# Patient Record
Sex: Female | Born: 1950
Health system: Southern US, Community
[De-identification: ages and names within clinical notes are randomized; demographics above are authoritative.]

## PROBLEM LIST (undated history)

## (undated) DIAGNOSIS — Z9889 Other specified postprocedural states: Secondary | ICD-10-CM

## (undated) DIAGNOSIS — T8859XA Other complications of anesthesia, initial encounter: Secondary | ICD-10-CM

## (undated) DIAGNOSIS — F32A Depression, unspecified: Secondary | ICD-10-CM

## (undated) DIAGNOSIS — F419 Anxiety disorder, unspecified: Secondary | ICD-10-CM

## (undated) DIAGNOSIS — IMO0002 Reserved for concepts with insufficient information to code with codable children: Secondary | ICD-10-CM

## (undated) DIAGNOSIS — F909 Attention-deficit hyperactivity disorder, unspecified type: Secondary | ICD-10-CM

## (undated) HISTORY — DX: Reserved for concepts with insufficient information to code with codable children: IMO0002

## (undated) HISTORY — PX: DILATION AND CURETTAGE OF UTERUS: SHX78

## (undated) HISTORY — PX: COLONOSCOPY: SHX174

---

## 1997-08-14 ENCOUNTER — Other Ambulatory Visit: Admission: RE | Admit: 1997-08-14 | Discharge: 1997-08-14 | Payer: Self-pay | Admitting: Obstetrics & Gynecology

## 1998-03-12 ENCOUNTER — Other Ambulatory Visit: Admission: RE | Admit: 1998-03-12 | Discharge: 1998-03-12 | Payer: Self-pay | Admitting: Obstetrics & Gynecology

## 1999-04-01 ENCOUNTER — Other Ambulatory Visit: Admission: RE | Admit: 1999-04-01 | Discharge: 1999-04-01 | Payer: Self-pay | Admitting: Obstetrics & Gynecology

## 2000-07-20 ENCOUNTER — Other Ambulatory Visit: Admission: RE | Admit: 2000-07-20 | Discharge: 2000-07-20 | Payer: Self-pay | Admitting: Obstetrics & Gynecology

## 2001-08-30 ENCOUNTER — Other Ambulatory Visit: Admission: RE | Admit: 2001-08-30 | Discharge: 2001-08-30 | Payer: Self-pay | Admitting: Obstetrics & Gynecology

## 2002-09-19 ENCOUNTER — Other Ambulatory Visit: Admission: RE | Admit: 2002-09-19 | Discharge: 2002-09-19 | Payer: Self-pay | Admitting: Obstetrics & Gynecology

## 2003-11-12 ENCOUNTER — Other Ambulatory Visit: Admission: RE | Admit: 2003-11-12 | Discharge: 2003-11-12 | Payer: Self-pay | Admitting: Obstetrics & Gynecology

## 2005-02-02 ENCOUNTER — Other Ambulatory Visit: Admission: RE | Admit: 2005-02-02 | Discharge: 2005-02-02 | Payer: Self-pay | Admitting: Obstetrics & Gynecology

## 2008-01-10 ENCOUNTER — Encounter: Admission: RE | Admit: 2008-01-10 | Discharge: 2008-01-10 | Payer: Self-pay | Admitting: Geriatric Medicine

## 2009-04-10 IMAGING — CT CT VIRTUAL COLONOSCOPY SCREENING
3 of 6 series · 13 of 46 positions shown, 19 images · non-contrast
Comparison: None

CLINICAL DATA: Incomplete colonoscopy 1 year ago

CT VIRTUAL COLONOSCOPY FOR SCREENING
TECHNIQUE: The patient was given a standard low so bowel
preparation with Gastrografin and barium for fluid and stool
tagging respectively.  The quality of the bowel preparation is
excellent.  Automated CO2 insufflation of the colon was performed
prior to image acquisition and colonic distention is excellent.

[Series 2: supine · axial · 0.62mm/px · z∈[-466,-114]mm · 9 of 353 slices shown, 15 images]
[im 36/353  soft-tissue]
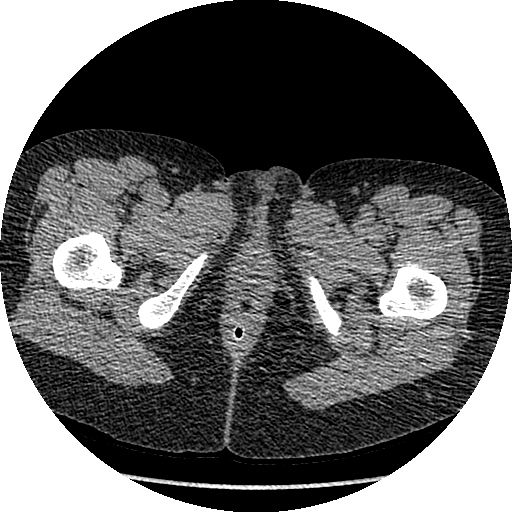
[im 36/353  bone]
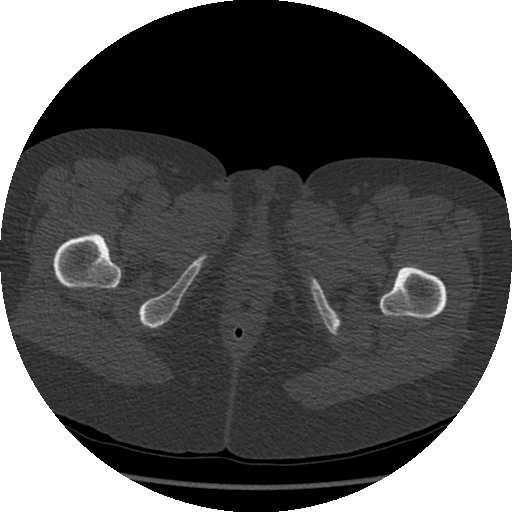
[im 71/353  soft-tissue]
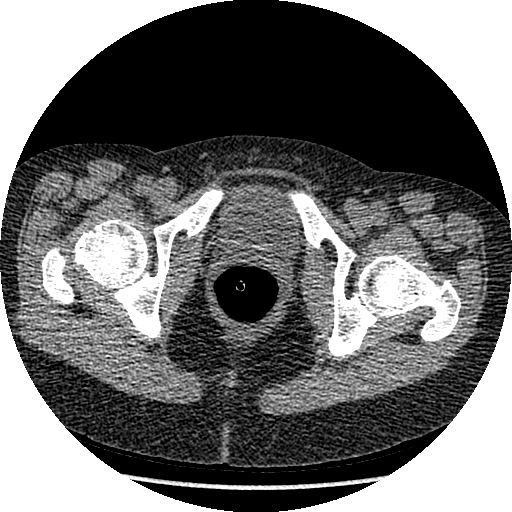
[im 106/353  soft-tissue]
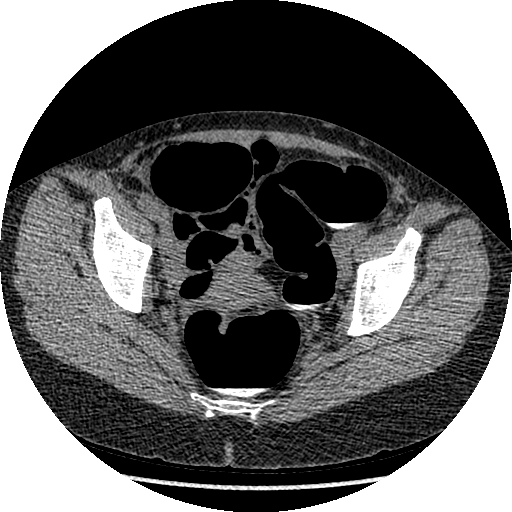
[im 141/353  soft-tissue]
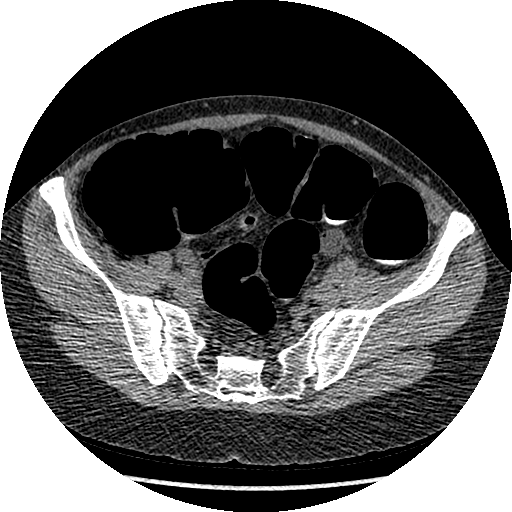
[im 177/353  soft-tissue]
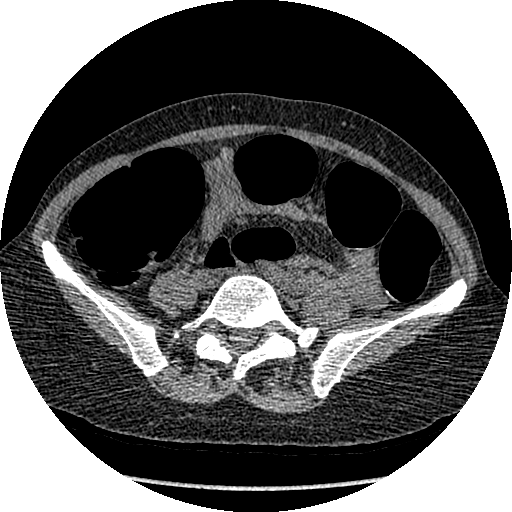
[im 212/353  soft-tissue]
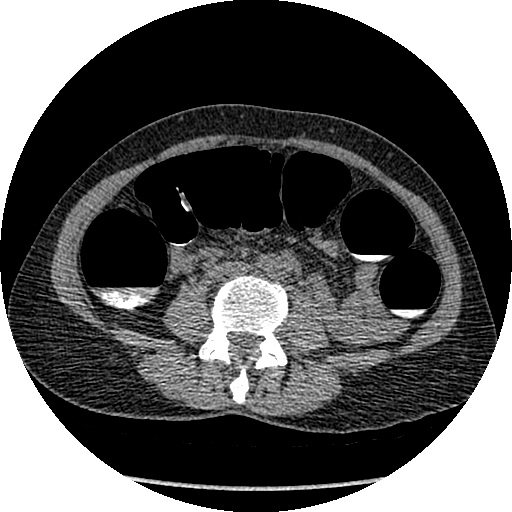
[im 212/353  lung]
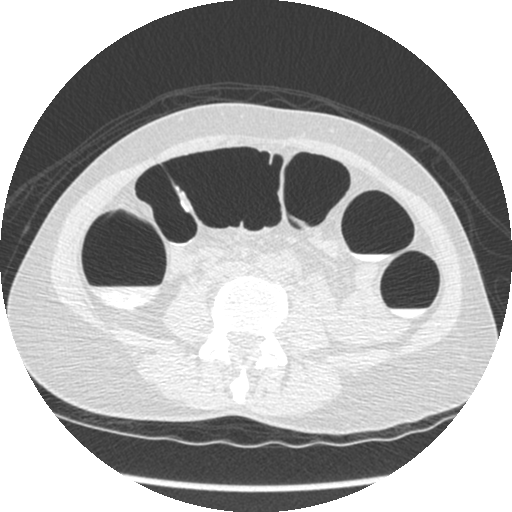
[im 247/353  soft-tissue]
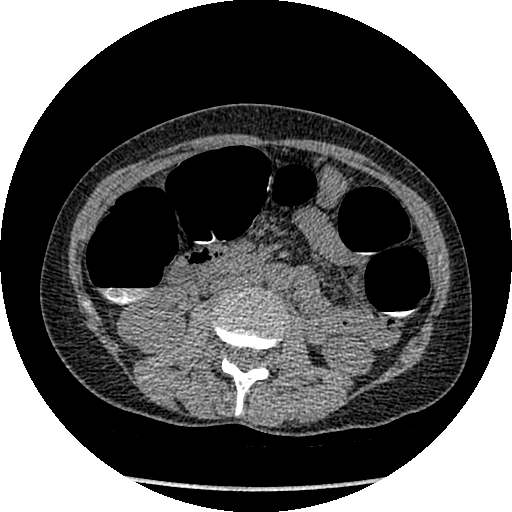
[im 247/353  lung]
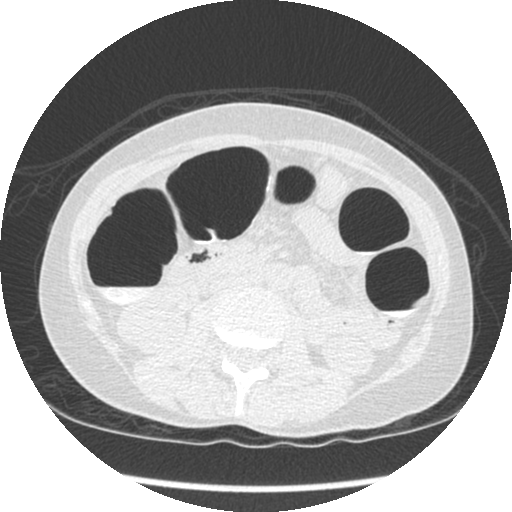
[im 282/353  soft-tissue]
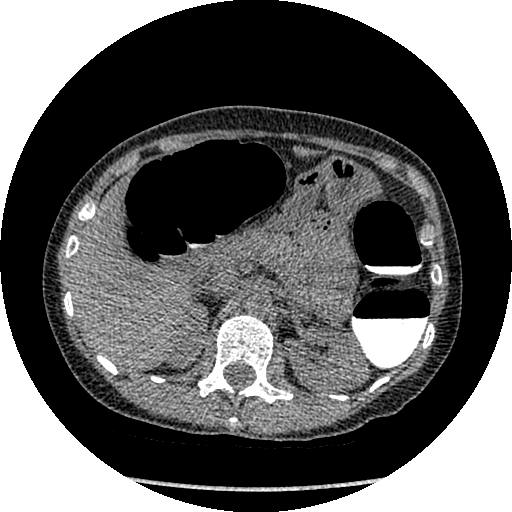
[im 282/353  lung]
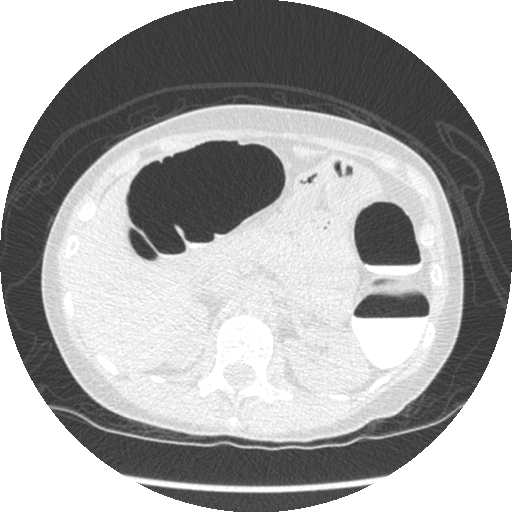
[im 317/353  soft-tissue]
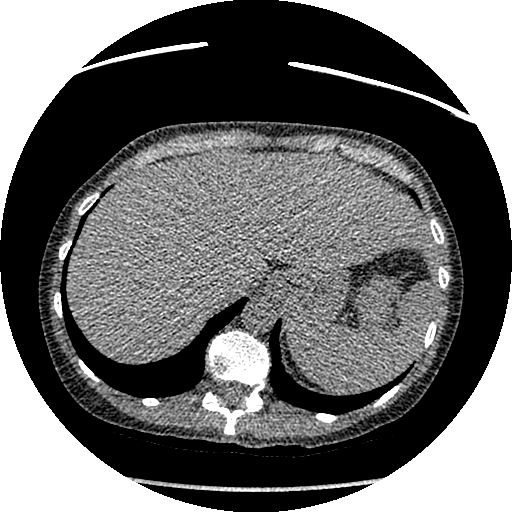
[im 317/353  lung]
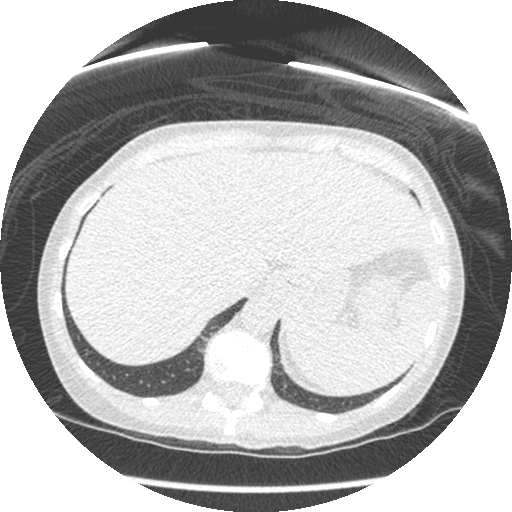
[im 317/353  bone]
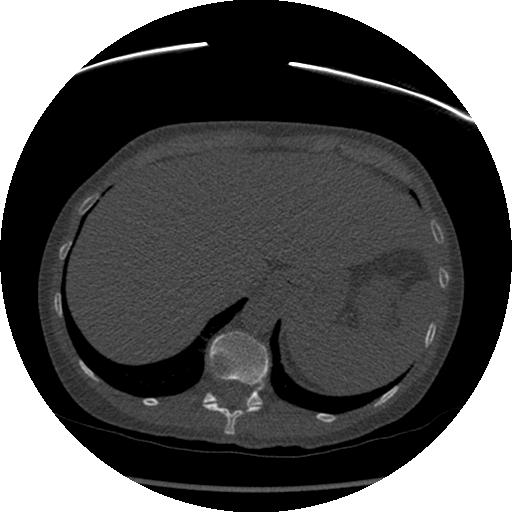

[Series 601: coronal body · coronal · 0.86mm/px · 1 of 97 slices shown]
[im 33/97  soft-tissue]
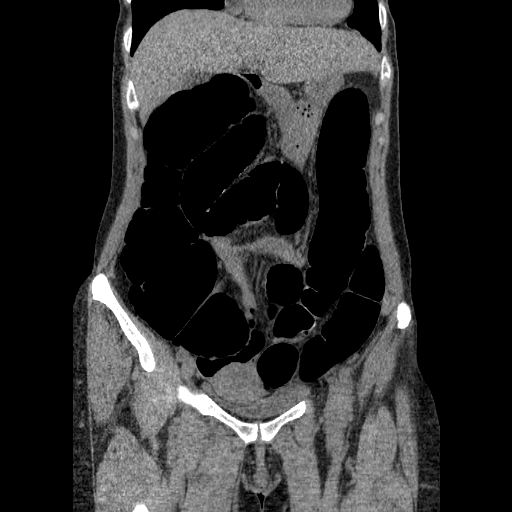

[Series 602: sagittal body · sagittal · 0.86mm/px · 3 of 129 slices shown]
[im 43/129  soft-tissue]
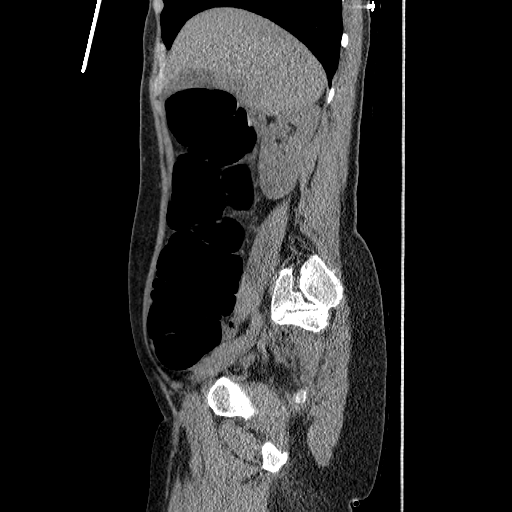
[im 57/129  soft-tissue]
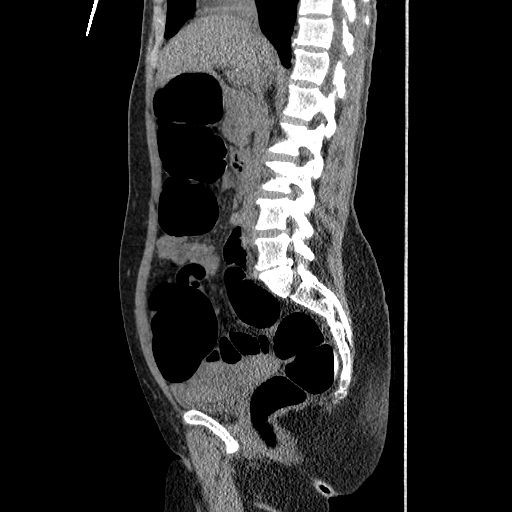
[im 72/129  soft-tissue]
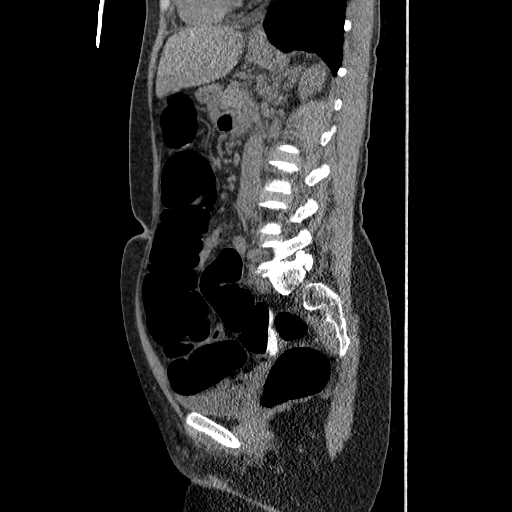

[13 of 46 positions shown; findings below may reference images not displayed]

FINDINGS: No clinically significant polyp is seen.  The small
amount of retained feces and fluid is well tagged.  The sigmoid
colon and the transverse colon are noted to be rather tortuous.
This study is considered C-Rads category C-1, normal colon, pain
routine screening.
IMPRESSION: No clinically significant polyp.  C-Rads category C-1,  i.e. normal
colon, continue routine screening.

Virtual colonoscopy is not designed to detect diminutive polyps
(i.e., less than or equal to 5 mm), the presence or absence of
which may not affect clinical management

CT ABDOMEN AND PELVIS WITHOUT CONTRAST

CT ABDOMEN
FINDINGS: The lung bases are clear.  The liver appears normal in
the unenhanced state.  No calcified gallstones are noted.  The
pancreas is normal in size and the pancreatic duct does not appear
to be dilated.  The adrenal glands and spleen appear normal.  The
kidneys appear normal in the unenhanced state with no mass or
hydronephrosis noted.  The abdominal aorta is normal in caliber.
IMPRESSION: Negative unenhanced CT of the abdomen.

CT PELVIS
FINDINGS: The uterus is normal in size.  The urinary bladder is
decompressed and cannot be evaluated.  No pelvic mass, fluid, or
adenopathy is seen.  There is degenerative disc disease at the L5-
S1 level.
IMPRESSION: Negative unenhanced CT of the pelvis.

## 2009-06-04 DIAGNOSIS — IMO0002 Reserved for concepts with insufficient information to code with codable children: Secondary | ICD-10-CM

## 2009-06-04 HISTORY — DX: Reserved for concepts with insufficient information to code with codable children: IMO0002

## 2009-06-04 HISTORY — PX: ABDOMINAL HYSTERECTOMY: SHX81

## 2009-11-17 ENCOUNTER — Ambulatory Visit: Admission: RE | Admit: 2009-11-17 | Discharge: 2009-11-17 | Payer: Self-pay | Admitting: Gynecologic Oncology

## 2009-11-17 ENCOUNTER — Other Ambulatory Visit: Admission: RE | Admit: 2009-11-17 | Discharge: 2009-11-17 | Payer: Self-pay | Admitting: Gynecologic Oncology

## 2010-08-03 ENCOUNTER — Ambulatory Visit: Payer: BC Managed Care – PPO | Attending: Gynecologic Oncology | Admitting: Gynecologic Oncology

## 2010-08-03 DIAGNOSIS — C549 Malignant neoplasm of corpus uteri, unspecified: Secondary | ICD-10-CM | POA: Insufficient documentation

## 2010-08-03 DIAGNOSIS — Z90710 Acquired absence of both cervix and uterus: Secondary | ICD-10-CM | POA: Insufficient documentation

## 2010-08-03 DIAGNOSIS — Z9079 Acquired absence of other genital organ(s): Secondary | ICD-10-CM | POA: Insufficient documentation

## 2010-08-04 NOTE — Consult Note (Signed)
Megan, Phillips           ACCOUNT NO.:  [id number]  MEDICAL RECORD NO.:  [id number]           PATIENT TYPE:  O  LOCATION:  GYN                          FACILITY:  [facility]  PHYSICIAN:  Yony Roulston A. Duard Brady, MD    DATE OF BIRTH:  26-Jan-1951  DATE OF CONSULTATION:  08/03/2010 DATE OF DISCHARGE:                                CONSULTATION   HISTORY:  Megan Phillips is a very pleasant 60 year old with history of vaginal bleeding and cramping.  In January 2011, endometrial stripe was noted to be 8 mm and she underwent an endometrial biopsy that revealed a grade 1 endometrioid adenocarcinoma.  In April 2011, she underwent robotic hysterectomy and BSO, pelvic lymph node dissection.  Operative findings included a 10 cm uterus with 2 to 3 pedunculated fibroids. Final pathology revealed a grade 1 endometrioid adenocarcinoma with 4% myometrial invasion.  No lymphovascular space involvement.  The adnexa, the washings, the cervix, and 0 out of 11 lymph nodes were negative. Her tumor was ER/PR positive.  I last saw her in September 2011, at which time her exam was unremarkable and a Pap smear was negative.  She was seen by Dr. Jennette Kettle about 4 months ago with negative exam and comes in today for followup.  She overall is doing quite well.  She did stop taking her Vivelle and she had been feeling well, but she noticed a decrease in her libido.  She put the patch back on a couple months ago and felt that her libido was improving and she would like to restart the Vivelle-Dot.  She denies any vaginal bleeding, change in bowel or bladder habits, abdominal or pelvic pain, is overall enjoying a good quality of life and is feeling quite well.  There are no new medical issues for her or her husband.  She continues to be very active with work and exercising.  PHYSICAL EXAMINATION:  VITAL SIGNS:  Weight 114 pounds, blood pressure 100/60, pulse 80, temperature 98.6. GENERAL:  Well-nourished, well-developed  female, in no acute distress. NECK:  Supple.  There is no lymphadenopathy, no thyromegaly. LUNGS:  Clear to auscultation bilaterally. CARDIOVASCULAR:  Regular rate and rhythm. ABDOMEN:  Shows well-healed laparoscopic incisions.  There is no evidence of any incisional hernias.  Abdomen is soft, nontender, nondistended.  No palpable masses or hepatosplenomegaly.  Groins are negative for adenopathy. EXTREMITIES:  There is no edema. PELVIC:  External genitalia is within normal limits.  The vagina is atrophic.  The vaginal cuff is visualized.  There are no visible lesions.  Bimanual examination reveals no masses or nodularity.  Rectal confirms.  ASSESSMENT:  60 year old with stage IA grade 1 endometrioid adenocarcinoma diagnosed and treated a year ago who has no evidence of recurrent disease.  PLAN: 1. I refilled her Vivelle.  She would like to drop down, so we wrote     her for Vivelle  0.025 dosage, she was given enough for a year. 2. She will see Dr. Jennette Kettle in 6 months and return to see [place] in 1 year.     Jamarri Vuncannon A. Duard Brady, MD     PAG/MEDQ  D:  08/03/2010  T:  08/04/2010  Job:  [id number]  cc:   Freddy Finner, M.D. Fax: [phone]  Hal T. Stoneking, M.D. Fax: [phone]  Telford Nab, R.N. [address] [place], [place] [address]  Electronically Signed by Cleda Mccreedy MD on 08/04/2010 09:08:29 AM

## 2011-10-24 ENCOUNTER — Encounter: Payer: Self-pay | Admitting: Gynecologic Oncology

## 2011-10-25 ENCOUNTER — Ambulatory Visit: Payer: BC Managed Care – PPO | Attending: Gynecologic Oncology | Admitting: Gynecologic Oncology

## 2011-10-25 ENCOUNTER — Encounter: Payer: Self-pay | Admitting: Gynecologic Oncology

## 2011-10-25 VITALS — BP 100/62 | HR 68 | Temp 98.5°F | Resp 14 | Ht 63.0 in | Wt 114.3 lb

## 2011-10-25 DIAGNOSIS — Z90710 Acquired absence of both cervix and uterus: Secondary | ICD-10-CM | POA: Insufficient documentation

## 2011-10-25 DIAGNOSIS — C541 Malignant neoplasm of endometrium: Secondary | ICD-10-CM | POA: Insufficient documentation

## 2011-10-25 DIAGNOSIS — Z9079 Acquired absence of other genital organ(s): Secondary | ICD-10-CM | POA: Insufficient documentation

## 2011-10-25 DIAGNOSIS — C549 Malignant neoplasm of corpus uteri, unspecified: Secondary | ICD-10-CM | POA: Insufficient documentation

## 2011-10-25 NOTE — Patient Instructions (Signed)
RTC 1 year

## 2011-10-25 NOTE — Progress Notes (Signed)
Consult Note: Gyn-Onc  Megan Phillips 61 y.o. female  CC:  Chief Complaint  Patient presents with  . Endo Adenocarcinoma    Follow up    HPI: Megan Phillips is a very pleasant 61 year old with history of vaginal bleeding and cramping. In January 2011, endometrial stripe was  noted to be 8 mm and she underwent an endometrial biopsy that revealed a grade 1 endometrioid adenocarcinoma. In April 2011, she underwent  robotic hysterectomy and BSO, pelvic lymph node dissection. Operative findings included a 10 cm uterus with 2 to 3 pedunculated fibroids.  Final pathology revealed a grade 1 endometrioid adenocarcinoma with 4% myometrial invasion. No lymphovascular space involvement. The adnexa, the washings, the cervix, and 0 out of 11 lymph nodes were negative. Her tumor was ER/PR positive. I last saw her in September 2011, at  which time her exam was unremarkable and a Pap smear was negative. She was seen by Dr. Jennette Kettle about 6 months ago with negative exam and pap smear and comes in today for followup.  Interval History:  She overall is doing quite well. She denies any vaginal bleeding, change in bowel or bladder habits, abdominal or pelvic pain, is overall enjoying a good quality of life and is feeling quite well. There are no new medical issues for her or her husband. She continues to be very active with  work and exercising 3 days a week. She uses her HRT primarily for sexual function.   Review of Systems: 10 point ROS is negative as reviewed with her.  Current Meds:  Outpatient Encounter Prescriptions as of 10/25/2011  Medication Sig Dispense Refill  . estradiol (VIVELLE-DOT) 0.0375 MG/24HR Place 1 patch onto the skin 2 (two) times a week.      . methylphenidate (RITALIN) 5 MG tablet Take 2.5 mg by mouth daily.        Allergy: No Known Allergies  Social Hx:   History   Social History  . Marital Status: Married    Spouse Name: N/A    Number of Children: N/A  . Years of Education:  N/A   Occupational History  . Not on file.   Social History Main Topics  . Smoking status: Never Smoker   . Smokeless tobacco: Not on file  . Alcohol Use: Yes     Few glasses of wine a week  . Drug Use: Not on file  . Sexually Active: Not on file   Other Topics Concern  . Not on file   Social History Narrative  . No narrative on file    Past Surgical Hx:  Past Surgical History  Procedure Date  . Abdominal hysterectomy 06/2009    Robotic Hyst, BSO, pelvic lymph node dissection    Past Medical Hx:  Past Medical History  Diagnosis Date  . Endometrioid adenocarcinoma 06/2009    Stage 1A grade 1    Family Hx: History reviewed. No pertinent family history.  Vitals:  Blood pressure 100/62, pulse 68, temperature 98.5 F (36.9 C), resp. rate 14, height 5\' 3"  (1.6 m), weight 114 lb 4.8 oz (51.846 kg).  Physical Exam:  GENERAL: Well-nourished, well-developed female, in no acute distress.  NECK: Supple. There is no lymphadenopathy, no thyromegaly.  LUNGS: Clear to auscultation bilaterally.  CARDIOVASCULAR: Regular rate and rhythm.  ABDOMEN: Shows well-healed laparoscopic incisions. There is no evidence of any incisional hernias. Abdomen is soft, nontender,  nondistended. No palpable masses or hepatosplenomegaly. Groins are negative for adenopathy.  EXTREMITIES: There is no edema.  PELVIC: External genitalia is within normal limits. The vagina is atrophic. The vaginal cuff is visualized. There are no visible  lesions. Bimanual examination reveals no masses or nodularity. Rectal confirms.   Assessment/Plan: ASSESSMENT: 61 year old with stage IA grade 1 endometrioid adenocarcinoma diagnosed and treated a year ago who has no evidence of  recurrent disease.   PLAN:  1.She will see Dr. Jennette Kettle in 6 months and return to see [place] in 1 year.  She has her annual exam with Dr. Pete Glatter next month.  Falynn Ailey A., MD 10/25/2011, 8:52 AM

## 2013-05-02 DIAGNOSIS — Z1231 Encounter for screening mammogram for malignant neoplasm of breast: Secondary | ICD-10-CM | POA: Diagnosis not present

## 2013-05-08 DIAGNOSIS — H1044 Vernal conjunctivitis: Secondary | ICD-10-CM | POA: Diagnosis not present

## 2013-05-08 DIAGNOSIS — Z9189 Other specified personal risk factors, not elsewhere classified: Secondary | ICD-10-CM | POA: Diagnosis not present

## 2013-09-26 DIAGNOSIS — H40059 Ocular hypertension, unspecified eye: Secondary | ICD-10-CM | POA: Diagnosis not present

## 2013-12-05 DIAGNOSIS — Z0000 Encounter for general adult medical examination without abnormal findings: Secondary | ICD-10-CM | POA: Diagnosis not present

## 2013-12-05 DIAGNOSIS — L989 Disorder of the skin and subcutaneous tissue, unspecified: Secondary | ICD-10-CM | POA: Diagnosis not present

## 2013-12-05 DIAGNOSIS — M859 Disorder of bone density and structure, unspecified: Secondary | ICD-10-CM | POA: Diagnosis not present

## 2013-12-05 DIAGNOSIS — Z79899 Other long term (current) drug therapy: Secondary | ICD-10-CM | POA: Diagnosis not present

## 2013-12-05 DIAGNOSIS — F900 Attention-deficit hyperactivity disorder, predominantly inattentive type: Secondary | ICD-10-CM | POA: Diagnosis not present

## 2013-12-05 DIAGNOSIS — Z23 Encounter for immunization: Secondary | ICD-10-CM | POA: Diagnosis not present

## 2014-10-21 DIAGNOSIS — H1013 Acute atopic conjunctivitis, bilateral: Secondary | ICD-10-CM | POA: Diagnosis not present

## 2014-12-11 DIAGNOSIS — Z0000 Encounter for general adult medical examination without abnormal findings: Secondary | ICD-10-CM | POA: Diagnosis not present

## 2014-12-11 DIAGNOSIS — Z1389 Encounter for screening for other disorder: Secondary | ICD-10-CM | POA: Diagnosis not present

## 2014-12-11 DIAGNOSIS — H9190 Unspecified hearing loss, unspecified ear: Secondary | ICD-10-CM | POA: Diagnosis not present

## 2014-12-11 DIAGNOSIS — R636 Underweight: Secondary | ICD-10-CM | POA: Diagnosis not present

## 2014-12-11 DIAGNOSIS — Z79899 Other long term (current) drug therapy: Secondary | ICD-10-CM | POA: Diagnosis not present

## 2014-12-11 DIAGNOSIS — F900 Attention-deficit hyperactivity disorder, predominantly inattentive type: Secondary | ICD-10-CM | POA: Diagnosis not present

## 2014-12-11 DIAGNOSIS — Z23 Encounter for immunization: Secondary | ICD-10-CM | POA: Diagnosis not present

## 2014-12-23 DIAGNOSIS — H903 Sensorineural hearing loss, bilateral: Secondary | ICD-10-CM | POA: Diagnosis not present

## 2014-12-23 DIAGNOSIS — H9312 Tinnitus, left ear: Secondary | ICD-10-CM | POA: Diagnosis not present

## 2015-04-14 ENCOUNTER — Ambulatory Visit (INDEPENDENT_AMBULATORY_CARE_PROVIDER_SITE_OTHER): Payer: Medicare Other | Admitting: Family Medicine

## 2015-04-14 VITALS — BP 100/66 | HR 90 | Temp 100.2°F | Resp 14 | Ht 64.5 in | Wt 113.4 lb

## 2015-04-14 DIAGNOSIS — R05 Cough: Secondary | ICD-10-CM

## 2015-04-14 DIAGNOSIS — J029 Acute pharyngitis, unspecified: Secondary | ICD-10-CM | POA: Diagnosis not present

## 2015-04-14 DIAGNOSIS — R059 Cough, unspecified: Secondary | ICD-10-CM

## 2015-04-14 DIAGNOSIS — R0981 Nasal congestion: Secondary | ICD-10-CM

## 2015-04-14 MED ORDER — ALBUTEROL SULFATE 108 (90 BASE) MCG/ACT IN AEPB: 2.0000 | INHALATION_SPRAY | Freq: Four times a day (QID) | RESPIRATORY_TRACT | Status: DC | PRN

## 2015-04-14 MED ORDER — AMOXICILLIN-POT CLAVULANATE 875-125 MG PO TABS: 1.0000 | ORAL_TABLET | Freq: Two times a day (BID) | ORAL | Status: DC

## 2015-04-14 NOTE — Patient Instructions (Signed)
Please let me know if symptoms change, worsen, or are not helped by medications.

## 2015-04-14 NOTE — Progress Notes (Signed)
By signing my name below, I, Moises Blood, attest that this documentation has been prepared under the direction and in the presence of Robyn Haber, MD. Electronically Signed: Moises Blood, [place]. 04/14/2015 , 6:43 PM .  Patient was seen in room 14 .   Patient ID: Megan Phillips MRN: [id number], DOB: Dec 19, 1950, 66 y.o. Date of Encounter: 04/14/2015  Primary Physician: No primary care provider on file.  Chief Complaint:  Chief Complaint  Patient presents with  . Sore Throat  . Fever    Unspecified    HPI:  Megan Phillips is a 65 y.o. female who presents to [facility] complaining of sore throat with some voice loss and fever (tmax 100.2) that started 4 days ago. She states that it has progressively gotten worse. She's been taking mucinex, sudafed, and aspirin for mild relief. She's also used a steam water humidifier. She mentions trying gargling too. She denies coughing at night.   She had a flu shot this season.   Past Medical History  Diagnosis Date  . Endometrioid adenocarcinoma 06/2009    Stage 1A grade 1     Home Meds: Prior to Admission medications   Medication Sig Start Date End Date Taking? Authorizing Provider  estradiol (VIVELLE-DOT) 0.0375 MG/24HR Place 1 patch onto the skin 2 (two) times a week.   Yes Historical Provider, MD  methylphenidate (RITALIN) 5 MG tablet Take 2.5 mg by mouth daily.   Yes Historical Provider, MD  Pseudoephedrine HCl (SUDAFED 12 HOUR PO) Take by mouth.   Yes Historical Provider, MD  Pseudoephedrine-Guaifenesin [facility] D PO) Take by mouth.   Yes Historical Provider, MD    Allergies: No Known Allergies  Social History   Social History  . Marital Status: Married    Spouse Name: N/A  . Number of Children: N/A  . Years of Education: N/A   Occupational History  . Not on file.   Social History Main Topics  . Smoking status: Never Smoker   . Smokeless tobacco: Not on file  . Alcohol Use: Yes   Comment: Few glasses of wine a week  . Drug Use: Not on file  . Sexual Activity: Not on file   Other Topics Concern  . Not on file   Social History Narrative     Review of Systems: Constitutional: negative for chills, night sweats, weight changes, or fatigue; positive for fever HEENT: negative for vision changes, hearing loss, congestion, rhinorrhea, epistaxis, or sinus pressure; positive for sore throat, voice loss Cardiovascular: negative for chest pain or palpitations Respiratory: negative for hemoptysis, wheezing, shortness of breath, or cough Abdominal: negative for abdominal pain, nausea, vomiting, diarrhea, or constipation Dermatological: negative for rash Neurologic: negative for headache, dizziness, or syncope All other systems reviewed and are otherwise negative with the exception to those above and in the HPI.  Physical Exam: Blood pressure 100/66, pulse 90, temperature 100.2 F (37.9 C), temperature source Oral, resp. rate 14, height 5' 4.5" (1.638 m), weight 113 lb 6.4 oz (51.438 kg), SpO2 99 %., Body mass index is 19.17 kg/(m^2). General: Well developed, well nourished, in no acute distress. Head: Normocephalic, atraumatic, eyes without discharge, sclera non-icteric, nares are without discharge. Bilateral auditory canals clear, TM's are without perforation, pearly grey and translucent with reflective cone of light bilaterally. Throat red, bilateral nasal passages thick mucus Neck: Supple. No thyromegaly. Full ROM. No lymphadenopathy. Lungs: Clear bilaterally to auscultation without rales, or rhonchi. Breathing is unlabored; few expiratory wheezes Heart: RRR  with S1 S2. No murmurs, rubs, or gallops appreciated. Msk:  Strength and tone normal for age. Extremities/Skin: Warm and dry. No clubbing or cyanosis. No edema. No rashes or suspicious lesions. Neuro: Alert and oriented X 3. Moves all extremities spontaneously. Gait is normal. CNII-XII grossly in tact. Psych:  Responds  to questions appropriately with a normal affect.   Labs:  ASSESSMENT AND PLAN:  65 y.o. year old female with sinusitis and bronchitis.  Patient will wait another day before launching into antibiotic This chart was scribed in my presence and reviewed by me personally.    ICD-9-CM ICD-10-CM   1. Sinus congestion 478.19 R09.81 amoxicillin-clavulanate (AUGMENTIN) 875-125 MG tablet  2. Sore throat 462 J02.9 amoxicillin-clavulanate (AUGMENTIN) 875-125 MG tablet     Culture, Group A Strep  3. Cough 786.2 R05 Albuterol Sulfate (PROAIR RESPICLICK) 123XX123 (90 Base) MCG/ACT AEPB      Signed, Robyn Haber, MD 04/14/2015 6:43 PM

## 2015-04-16 LAB — CULTURE, GROUP A STREP: Organism ID, Bacteria: NORMAL

## 2015-04-21 ENCOUNTER — Encounter: Payer: Self-pay | Admitting: *Deleted

## 2015-05-13 DIAGNOSIS — Z1231 Encounter for screening mammogram for malignant neoplasm of breast: Secondary | ICD-10-CM | POA: Diagnosis not present

## 2015-05-17 DIAGNOSIS — Z124 Encounter for screening for malignant neoplasm of cervix: Secondary | ICD-10-CM | POA: Diagnosis not present

## 2015-05-17 DIAGNOSIS — Z01419 Encounter for gynecological examination (general) (routine) without abnormal findings: Secondary | ICD-10-CM | POA: Diagnosis not present

## 2015-05-17 DIAGNOSIS — Z779 Other contact with and (suspected) exposures hazardous to health: Secondary | ICD-10-CM | POA: Diagnosis not present

## 2015-05-17 DIAGNOSIS — Z681 Body mass index (BMI) 19 or less, adult: Secondary | ICD-10-CM | POA: Diagnosis not present

## 2015-10-11 DIAGNOSIS — F900 Attention-deficit hyperactivity disorder, predominantly inattentive type: Secondary | ICD-10-CM | POA: Diagnosis not present

## 2015-12-07 DIAGNOSIS — H1013 Acute atopic conjunctivitis, bilateral: Secondary | ICD-10-CM | POA: Diagnosis not present

## 2015-12-20 DIAGNOSIS — Z23 Encounter for immunization: Secondary | ICD-10-CM | POA: Diagnosis not present

## 2015-12-20 DIAGNOSIS — Z1389 Encounter for screening for other disorder: Secondary | ICD-10-CM | POA: Diagnosis not present

## 2015-12-20 DIAGNOSIS — F900 Attention-deficit hyperactivity disorder, predominantly inattentive type: Secondary | ICD-10-CM | POA: Diagnosis not present

## 2015-12-20 DIAGNOSIS — Z0000 Encounter for general adult medical examination without abnormal findings: Secondary | ICD-10-CM | POA: Diagnosis not present

## 2015-12-20 DIAGNOSIS — Z79899 Other long term (current) drug therapy: Secondary | ICD-10-CM | POA: Diagnosis not present

## 2016-06-12 DIAGNOSIS — Z01419 Encounter for gynecological examination (general) (routine) without abnormal findings: Secondary | ICD-10-CM | POA: Diagnosis not present

## 2016-06-12 DIAGNOSIS — Z6820 Body mass index (BMI) 20.0-20.9, adult: Secondary | ICD-10-CM | POA: Diagnosis not present

## 2016-06-12 DIAGNOSIS — Z124 Encounter for screening for malignant neoplasm of cervix: Secondary | ICD-10-CM | POA: Diagnosis not present

## 2016-06-19 DIAGNOSIS — F325 Major depressive disorder, single episode, in full remission: Secondary | ICD-10-CM | POA: Diagnosis not present

## 2016-06-19 DIAGNOSIS — Z23 Encounter for immunization: Secondary | ICD-10-CM | POA: Diagnosis not present

## 2016-06-19 DIAGNOSIS — F900 Attention-deficit hyperactivity disorder, predominantly inattentive type: Secondary | ICD-10-CM | POA: Diagnosis not present

## 2016-06-19 DIAGNOSIS — G2581 Restless legs syndrome: Secondary | ICD-10-CM | POA: Diagnosis not present

## 2016-10-03 ENCOUNTER — Other Ambulatory Visit: Payer: Self-pay | Admitting: Internal Medicine

## 2016-10-03 ENCOUNTER — Ambulatory Visit
Admission: RE | Admit: 2016-10-03 | Discharge: 2016-10-03 | Disposition: A | Discharge status: Home / Self Care | Payer: Medicare Other | Source: Ambulatory Visit | Attending: Internal Medicine | Admitting: Internal Medicine

## 2016-10-03 DIAGNOSIS — M8580 Other specified disorders of bone density and structure, unspecified site: Secondary | ICD-10-CM | POA: Diagnosis not present

## 2016-10-03 DIAGNOSIS — M79642 Pain in left hand: Secondary | ICD-10-CM | POA: Diagnosis not present

## 2016-10-03 DIAGNOSIS — S6992XA Unspecified injury of left wrist, hand and finger(s), initial encounter: Secondary | ICD-10-CM | POA: Diagnosis not present

## 2016-10-03 DIAGNOSIS — W19XXXD Unspecified fall, subsequent encounter: Secondary | ICD-10-CM | POA: Diagnosis not present

## 2016-12-22 DIAGNOSIS — Z23 Encounter for immunization: Secondary | ICD-10-CM | POA: Diagnosis not present

## 2016-12-22 DIAGNOSIS — F325 Major depressive disorder, single episode, in full remission: Secondary | ICD-10-CM | POA: Diagnosis not present

## 2016-12-22 DIAGNOSIS — Z0000 Encounter for general adult medical examination without abnormal findings: Secondary | ICD-10-CM | POA: Diagnosis not present

## 2016-12-22 DIAGNOSIS — F900 Attention-deficit hyperactivity disorder, predominantly inattentive type: Secondary | ICD-10-CM | POA: Diagnosis not present

## 2016-12-22 DIAGNOSIS — Z1389 Encounter for screening for other disorder: Secondary | ICD-10-CM | POA: Diagnosis not present

## 2016-12-22 DIAGNOSIS — Z79899 Other long term (current) drug therapy: Secondary | ICD-10-CM | POA: Diagnosis not present

## 2017-04-10 DIAGNOSIS — Z1231 Encounter for screening mammogram for malignant neoplasm of breast: Secondary | ICD-10-CM | POA: Diagnosis not present

## 2017-06-14 DIAGNOSIS — Z779 Other contact with and (suspected) exposures hazardous to health: Secondary | ICD-10-CM | POA: Diagnosis not present

## 2017-06-14 DIAGNOSIS — Z681 Body mass index (BMI) 19 or less, adult: Secondary | ICD-10-CM | POA: Diagnosis not present

## 2017-06-14 DIAGNOSIS — Z01419 Encounter for gynecological examination (general) (routine) without abnormal findings: Secondary | ICD-10-CM | POA: Diagnosis not present

## 2017-06-14 DIAGNOSIS — Z124 Encounter for screening for malignant neoplasm of cervix: Secondary | ICD-10-CM | POA: Diagnosis not present

## 2017-07-24 DIAGNOSIS — H1013 Acute atopic conjunctivitis, bilateral: Secondary | ICD-10-CM | POA: Diagnosis not present

## 2017-10-30 ENCOUNTER — Emergency Department (HOSPITAL_COMMUNITY)
Admission: EM | Admit: 2017-10-30 | Discharge: 2017-10-30 | Disposition: A | Discharge status: Home / Self Care | Payer: Medicare Other | Attending: Emergency Medicine | Admitting: Emergency Medicine

## 2017-10-30 ENCOUNTER — Encounter (HOSPITAL_COMMUNITY): Payer: Self-pay | Admitting: Emergency Medicine

## 2017-10-30 DIAGNOSIS — S80211A Abrasion, right knee, initial encounter: Secondary | ICD-10-CM | POA: Diagnosis not present

## 2017-10-30 DIAGNOSIS — Z79899 Other long term (current) drug therapy: Secondary | ICD-10-CM | POA: Insufficient documentation

## 2017-10-30 DIAGNOSIS — S0081XA Abrasion of other part of head, initial encounter: Secondary | ICD-10-CM | POA: Diagnosis not present

## 2017-10-30 DIAGNOSIS — S80212A Abrasion, left knee, initial encounter: Secondary | ICD-10-CM | POA: Diagnosis not present

## 2017-10-30 DIAGNOSIS — Y929 Unspecified place or not applicable: Secondary | ICD-10-CM | POA: Insufficient documentation

## 2017-10-30 DIAGNOSIS — W19XXXA Unspecified fall, initial encounter: Secondary | ICD-10-CM

## 2017-10-30 DIAGNOSIS — W1839XA Other fall on same level, initial encounter: Secondary | ICD-10-CM | POA: Diagnosis not present

## 2017-10-30 DIAGNOSIS — S0083XA Contusion of other part of head, initial encounter: Secondary | ICD-10-CM | POA: Diagnosis not present

## 2017-10-30 DIAGNOSIS — Y9302 Activity, running: Secondary | ICD-10-CM | POA: Diagnosis not present

## 2017-10-30 DIAGNOSIS — S60512A Abrasion of left hand, initial encounter: Secondary | ICD-10-CM | POA: Diagnosis not present

## 2017-10-30 DIAGNOSIS — Y999 Unspecified external cause status: Secondary | ICD-10-CM | POA: Insufficient documentation

## 2017-10-30 DIAGNOSIS — T07XXXA Unspecified multiple injuries, initial encounter: Secondary | ICD-10-CM

## 2017-10-30 DIAGNOSIS — S0993XA Unspecified injury of face, initial encounter: Secondary | ICD-10-CM | POA: Diagnosis present

## 2017-10-30 NOTE — ED Triage Notes (Signed)
Per pt, states she tripped while she was jogging landing on her left side-abrasions to both knees and left cheek-states some soreness to left jaw

## 2017-10-30 NOTE — ED Notes (Signed)
Pt ambulatory to room.

## 2017-10-30 NOTE — ED Provider Notes (Signed)
[facility] DEPT Provider Note   CSN: [id number] Arrival date & time: 10/30/17  4818     History   Chief Complaint Chief Complaint  Patient presents with  . Fall    HPI Megan Phillips is a 67 y.o. female.  HPI Patient presents after a fall.  States she was out jogging and was distracted and fell.  Landed onto her face knees and arm.  Complaining only of some pain in her left jaw.  No loss conscious.  She is not on anticoagulation.  She states she is able to open or close her jaw and thinks it is not broken. Past Medical History:  Diagnosis Date  . Endometrioid adenocarcinoma 06/2009   Stage 1A grade 1    Patient Active Problem List   Diagnosis Date Noted  . Endometrial adenocarcinoma ([place]) 10/25/2011    Past Surgical History:  Procedure Laterality Date  . ABDOMINAL HYSTERECTOMY  06/2009   Robotic Hyst, BSO, pelvic lymph node dissection     OB History   None      Home Medications    Prior to Admission medications   Medication Sig Start Date End Date Taking? Authorizing Provider  Albuterol Sulfate (PROAIR RESPICLICK) 563 (90 Base) MCG/ACT AEPB Inhale 2 puffs into the lungs every 6 (six) hours as needed. 04/14/15   Robyn Haber, MD  amoxicillin-clavulanate (AUGMENTIN) 875-125 MG tablet Take 1 tablet by mouth 2 (two) times daily. 04/14/15   Robyn Haber, MD  estradiol (VIVELLE-DOT) 0.0375 MG/24HR Place 1 patch onto the skin 2 (two) times a week.    [provider]  methylphenidate (RITALIN) 5 MG tablet Take 2.5 mg by mouth daily.    [provider]  Pseudoephedrine HCl (SUDAFED 12 HOUR PO) Take by mouth.    [provider]  Pseudoephedrine-Guaifenesin [facility] D PO) Take by mouth.    [provider]    Family History Family History  Problem Relation Age of Onset  . Hyperlipidemia Mother   . Heart disease Mother   . Stroke Mother   . Heart disease Father   . Heart disease Brother      Social History Social History   Tobacco Use  . Smoking status: Never Smoker  Substance Use Topics  . Alcohol use: Yes    Comment: Few glasses of wine a week  . Drug use: Not on file     Allergies   Patient has no known allergies.   Review of Systems Review of Systems  Constitutional: Negative for appetite change.  HENT: Negative for congestion.   Respiratory: Negative for shortness of breath.   Cardiovascular: Negative for chest pain.  Gastrointestinal: Negative for abdominal pain.  Genitourinary: Negative for flank pain.  Musculoskeletal: Negative for back pain.  Skin: Negative for rash.  Neurological: Negative for numbness.  Psychiatric/Behavioral: Negative for confusion.     Physical Exam Updated Vital Signs BP 135/81 (BP Location: Left Arm)   Pulse 68   Temp 98.2 F (36.8 C) (Oral)   Resp 16   Ht 5\' 3"  (1.6 m)   Wt 51.7 kg   SpO2 100%   BMI 20.19 kg/m   Physical Exam  Constitutional: She appears well-developed.  HENT:  Abrasion to left zygomatic area.  No underlying bony tenderness.  Mild hematoma and tenderness to left anterior jaw.  No underlying bony tenderness.  Some soft tissue swelling.  Good opening closing the jaw.  No tenderness at TMJ.  Eyes: EOM are normal.  Neck:  Neck supple.  Cardiovascular: Normal rate.  Pulmonary/Chest: Effort normal.  Abdominal: There is no tenderness.  Musculoskeletal:  Abrasion to medial aspect of left hand.  No underlying bony tenderness.  Abrasions to bilateral knees without underlying bony tenderness.  Neurological: She is alert.  Skin: Skin is warm. Capillary refill takes less than 2 seconds.  Psychiatric: She has a normal mood and affect.     ED Treatments / Results  Labs (all labs ordered are listed, but only abnormal results are displayed) Labs Reviewed - No data to display  EKG None  Radiology No results found.  Procedures Procedures (including critical care time)  Medications Ordered in  ED Medications - No data to display   Initial Impression / Assessment and Plan / ED Course  I have reviewed the triage vital signs and the nursing notes.  Pertinent labs & imaging results that were available during my care of the patient were reviewed by me and considered in my medical decision making (see chart for details).     Patient with fall.  Has contusions and abrasions but did not appear to have severe injury.  Will discharge home.  Well-appearing.  Final Clinical Impressions(s) / ED Diagnoses   Final diagnoses:  Fall, initial encounter  Contusion of face, initial encounter  Abrasions of multiple sites    ED Discharge Orders    None       Davonna Belling, MD 10/30/17 0900

## 2017-11-22 DIAGNOSIS — Z23 Encounter for immunization: Secondary | ICD-10-CM | POA: Diagnosis not present

## 2017-11-22 DIAGNOSIS — L259 Unspecified contact dermatitis, unspecified cause: Secondary | ICD-10-CM | POA: Diagnosis not present

## 2017-12-28 DIAGNOSIS — Z0000 Encounter for general adult medical examination without abnormal findings: Secondary | ICD-10-CM | POA: Diagnosis not present

## 2017-12-28 DIAGNOSIS — F325 Major depressive disorder, single episode, in full remission: Secondary | ICD-10-CM | POA: Diagnosis not present

## 2017-12-28 DIAGNOSIS — Z1389 Encounter for screening for other disorder: Secondary | ICD-10-CM | POA: Diagnosis not present

## 2017-12-28 DIAGNOSIS — F900 Attention-deficit hyperactivity disorder, predominantly inattentive type: Secondary | ICD-10-CM | POA: Diagnosis not present

## 2017-12-28 DIAGNOSIS — Z79899 Other long term (current) drug therapy: Secondary | ICD-10-CM | POA: Diagnosis not present

## 2018-01-02 IMAGING — CR DG HAND COMPLETE 3+V*L*
3 series · 3 of 3 positions shown · non-contrast
Comparison: None in PACs

CLINICAL DATA: Status post fall 1 week ago with persistent hand
pain in the metacarpal region.

EXAM:
LEFT HAND - COMPLETE 3+ VIEW

[x hand pa left]
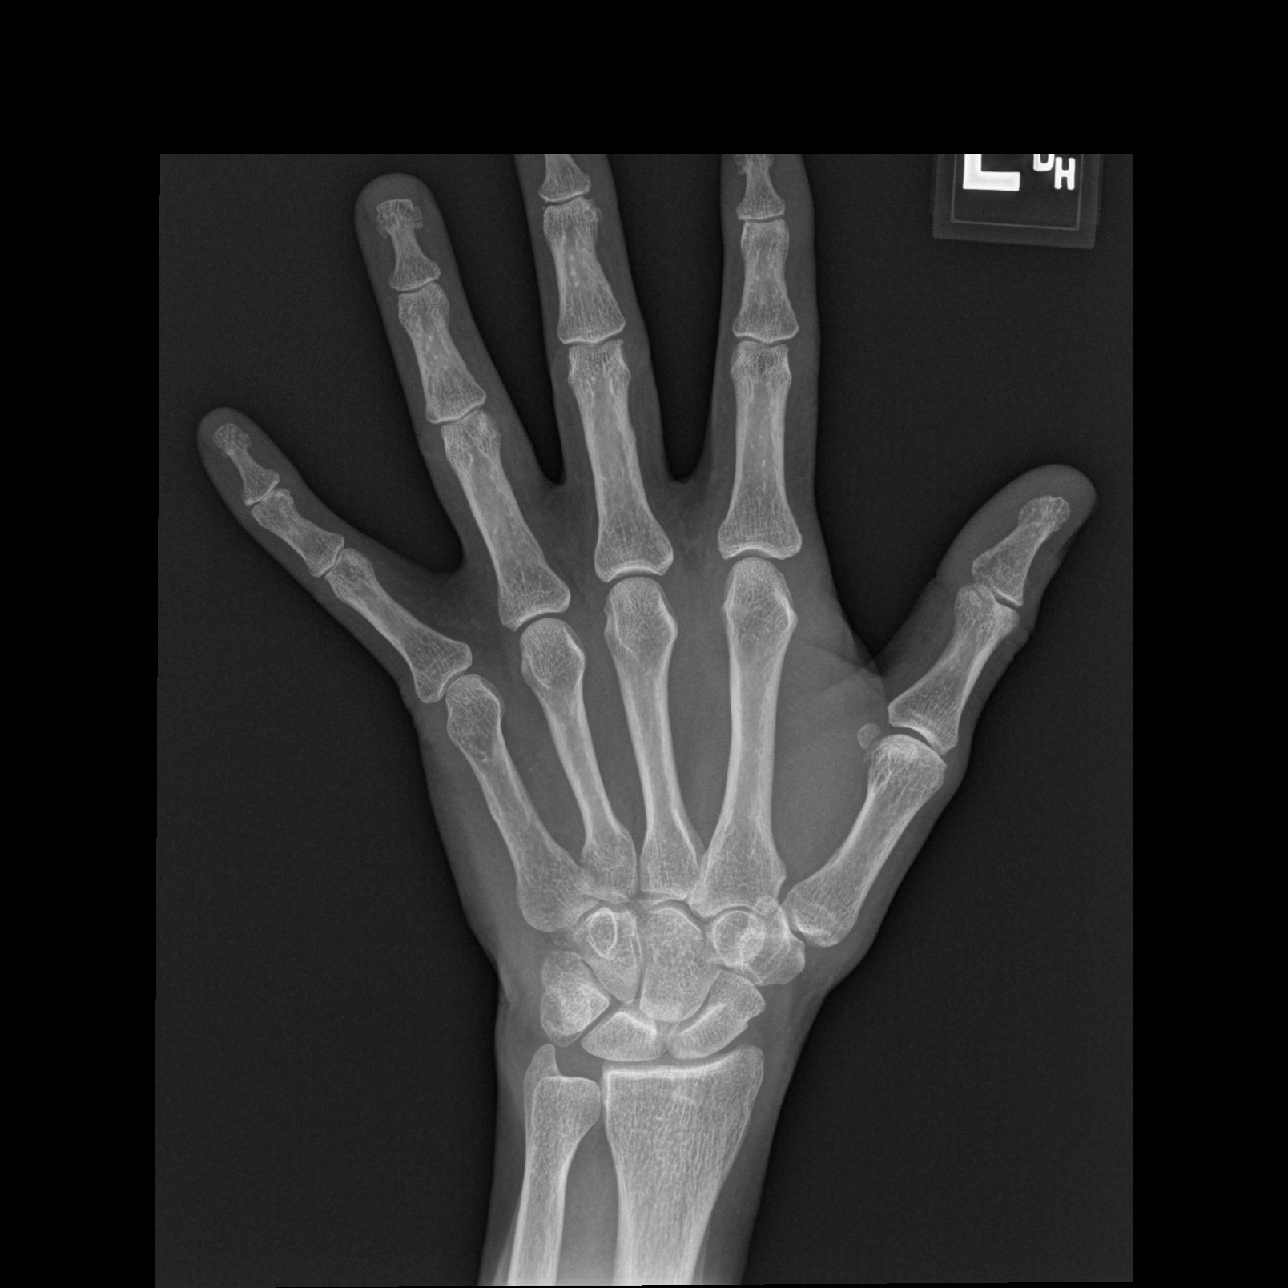

[x hand oblique left]
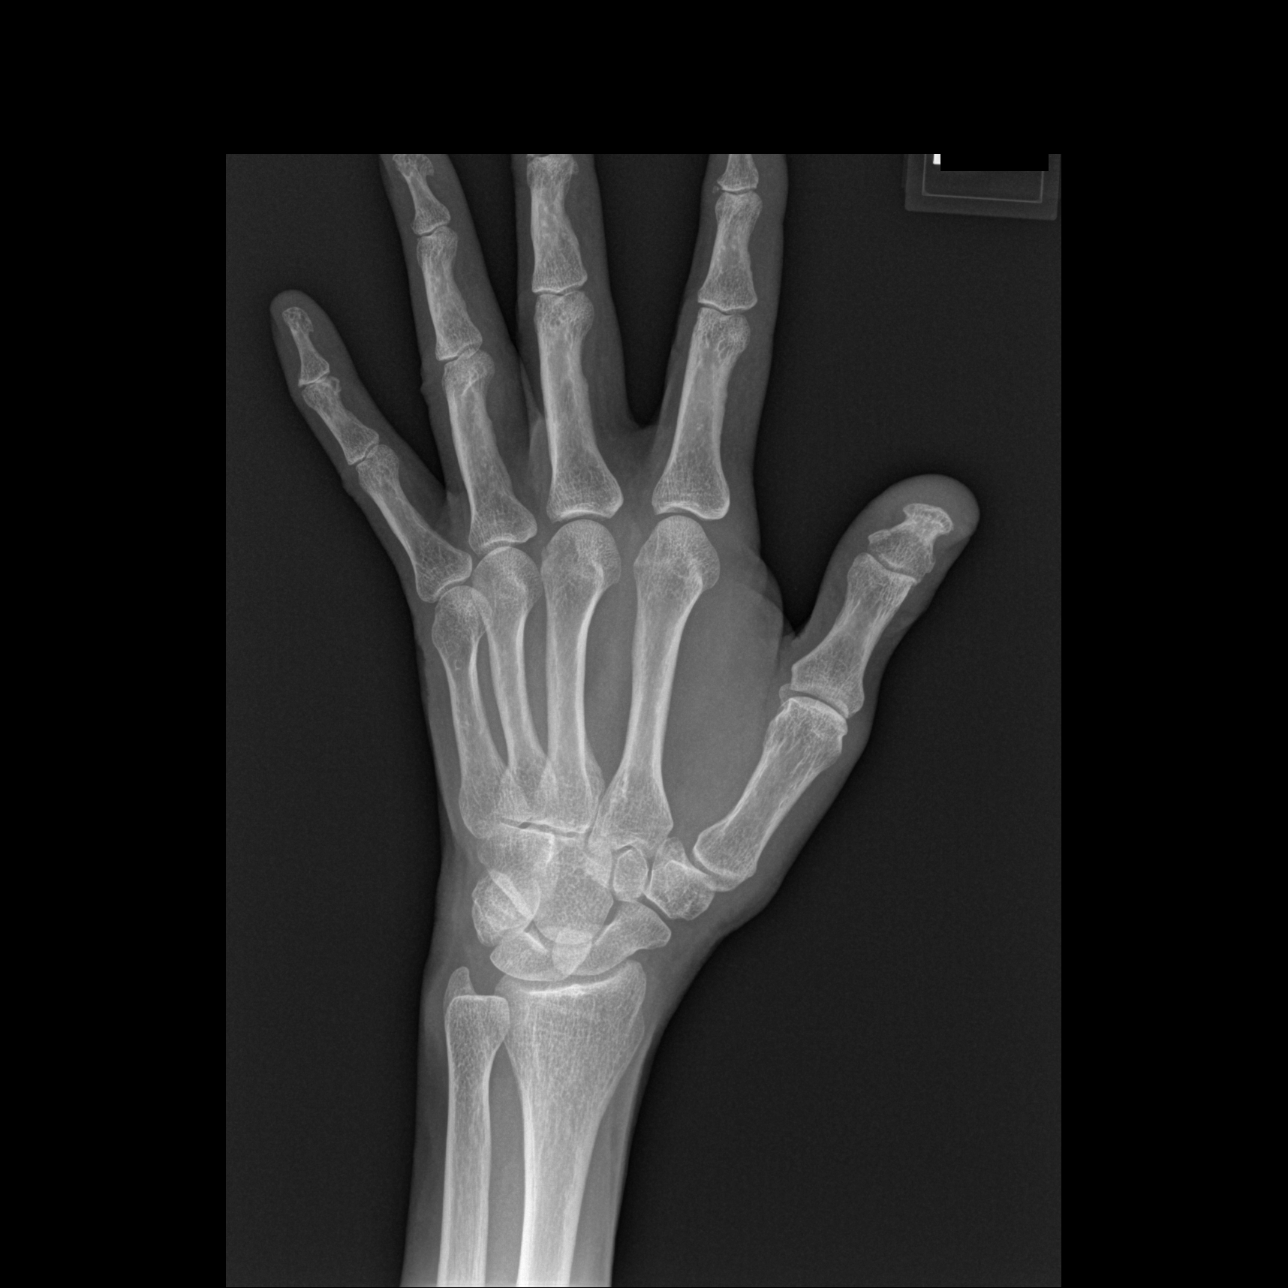

[x hand lat left]
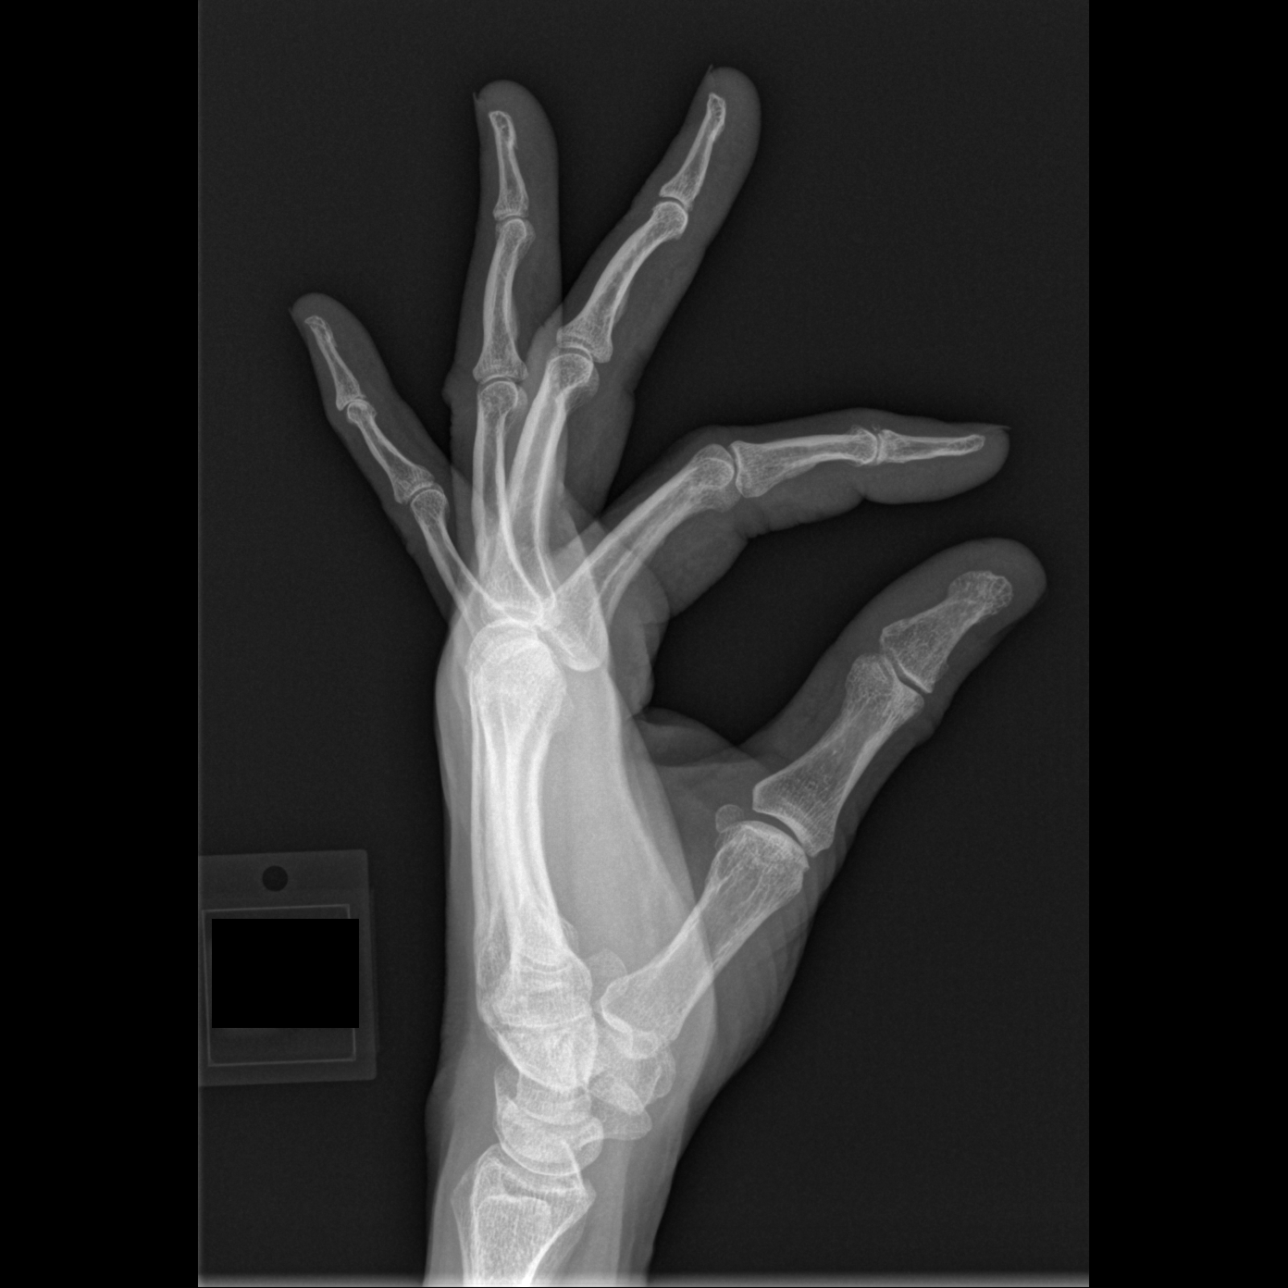

[3 of 3 positions shown; findings below may reference images not displayed]

FINDINGS: The bones of the left hand are subjectively adequately mineralized.
No acute fracture or dislocation is observed. Specific attention to
the metacarpals reveals no acute abnormality. The joint spaces are
reasonably well-maintained.
IMPRESSION: There is no acute or significant chronic bony abnormality of the
left hand.

## 2018-01-24 DIAGNOSIS — Q438 Other specified congenital malformations of intestine: Secondary | ICD-10-CM | POA: Diagnosis not present

## 2018-01-24 DIAGNOSIS — Z1211 Encounter for screening for malignant neoplasm of colon: Secondary | ICD-10-CM | POA: Diagnosis not present

## 2018-03-11 DIAGNOSIS — Z1211 Encounter for screening for malignant neoplasm of colon: Secondary | ICD-10-CM | POA: Diagnosis not present

## 2018-08-30 DIAGNOSIS — Z1231 Encounter for screening mammogram for malignant neoplasm of breast: Secondary | ICD-10-CM | POA: Diagnosis not present

## 2018-09-30 DIAGNOSIS — Z681 Body mass index (BMI) 19 or less, adult: Secondary | ICD-10-CM | POA: Diagnosis not present

## 2018-09-30 DIAGNOSIS — Z124 Encounter for screening for malignant neoplasm of cervix: Secondary | ICD-10-CM | POA: Diagnosis not present

## 2019-01-13 DIAGNOSIS — F900 Attention-deficit hyperactivity disorder, predominantly inattentive type: Secondary | ICD-10-CM | POA: Diagnosis not present

## 2019-01-13 DIAGNOSIS — Z1389 Encounter for screening for other disorder: Secondary | ICD-10-CM | POA: Diagnosis not present

## 2019-01-13 DIAGNOSIS — F325 Major depressive disorder, single episode, in full remission: Secondary | ICD-10-CM | POA: Diagnosis not present

## 2019-01-13 DIAGNOSIS — Z79899 Other long term (current) drug therapy: Secondary | ICD-10-CM | POA: Diagnosis not present

## 2019-01-13 DIAGNOSIS — Z0000 Encounter for general adult medical examination without abnormal findings: Secondary | ICD-10-CM | POA: Diagnosis not present

## 2019-01-13 DIAGNOSIS — M8580 Other specified disorders of bone density and structure, unspecified site: Secondary | ICD-10-CM | POA: Diagnosis not present

## 2019-01-13 DIAGNOSIS — G2581 Restless legs syndrome: Secondary | ICD-10-CM | POA: Diagnosis not present

## 2019-01-13 DIAGNOSIS — Z23 Encounter for immunization: Secondary | ICD-10-CM | POA: Diagnosis not present

## 2019-01-14 DIAGNOSIS — H33302 Unspecified retinal break, left eye: Secondary | ICD-10-CM | POA: Diagnosis not present

## 2019-01-14 DIAGNOSIS — H43812 Vitreous degeneration, left eye: Secondary | ICD-10-CM | POA: Diagnosis not present

## 2019-01-28 DIAGNOSIS — H43812 Vitreous degeneration, left eye: Secondary | ICD-10-CM | POA: Diagnosis not present

## 2019-04-18 ENCOUNTER — Ambulatory Visit: Payer: Medicare Other

## 2019-10-08 DIAGNOSIS — R921 Mammographic calcification found on diagnostic imaging of breast: Secondary | ICD-10-CM | POA: Diagnosis not present

## 2019-10-09 DIAGNOSIS — H43812 Vitreous degeneration, left eye: Secondary | ICD-10-CM | POA: Diagnosis not present

## 2019-10-09 DIAGNOSIS — H5203 Hypermetropia, bilateral: Secondary | ICD-10-CM | POA: Diagnosis not present

## 2019-10-13 DIAGNOSIS — Z01419 Encounter for gynecological examination (general) (routine) without abnormal findings: Secondary | ICD-10-CM | POA: Diagnosis not present

## 2019-10-13 DIAGNOSIS — Z124 Encounter for screening for malignant neoplasm of cervix: Secondary | ICD-10-CM | POA: Diagnosis not present

## 2019-10-13 DIAGNOSIS — Z681 Body mass index (BMI) 19 or less, adult: Secondary | ICD-10-CM | POA: Diagnosis not present

## 2019-10-13 DIAGNOSIS — Z779 Other contact with and (suspected) exposures hazardous to health: Secondary | ICD-10-CM | POA: Diagnosis not present

## 2019-12-06 DIAGNOSIS — Z23 Encounter for immunization: Secondary | ICD-10-CM | POA: Diagnosis not present

## 2019-12-21 DIAGNOSIS — Z20822 Contact with and (suspected) exposure to covid-19: Secondary | ICD-10-CM | POA: Diagnosis not present

## 2019-12-22 DIAGNOSIS — Z20828 Contact with and (suspected) exposure to other viral communicable diseases: Secondary | ICD-10-CM | POA: Diagnosis not present

## 2019-12-23 DIAGNOSIS — Z20828 Contact with and (suspected) exposure to other viral communicable diseases: Secondary | ICD-10-CM | POA: Diagnosis not present

## 2020-01-14 DIAGNOSIS — G2581 Restless legs syndrome: Secondary | ICD-10-CM | POA: Diagnosis not present

## 2020-01-14 DIAGNOSIS — Z79899 Other long term (current) drug therapy: Secondary | ICD-10-CM | POA: Diagnosis not present

## 2020-01-14 DIAGNOSIS — F900 Attention-deficit hyperactivity disorder, predominantly inattentive type: Secondary | ICD-10-CM | POA: Diagnosis not present

## 2020-01-14 DIAGNOSIS — Z1389 Encounter for screening for other disorder: Secondary | ICD-10-CM | POA: Diagnosis not present

## 2020-01-14 DIAGNOSIS — Z1159 Encounter for screening for other viral diseases: Secondary | ICD-10-CM | POA: Diagnosis not present

## 2020-01-14 DIAGNOSIS — F325 Major depressive disorder, single episode, in full remission: Secondary | ICD-10-CM | POA: Diagnosis not present

## 2020-01-14 DIAGNOSIS — Z0000 Encounter for general adult medical examination without abnormal findings: Secondary | ICD-10-CM | POA: Diagnosis not present

## 2020-01-14 DIAGNOSIS — Z136 Encounter for screening for cardiovascular disorders: Secondary | ICD-10-CM | POA: Diagnosis not present

## 2020-01-14 DIAGNOSIS — Z23 Encounter for immunization: Secondary | ICD-10-CM | POA: Diagnosis not present

## 2020-04-23 DIAGNOSIS — R921 Mammographic calcification found on diagnostic imaging of breast: Secondary | ICD-10-CM | POA: Diagnosis not present

## 2020-06-16 DIAGNOSIS — Z20828 Contact with and (suspected) exposure to other viral communicable diseases: Secondary | ICD-10-CM | POA: Diagnosis not present

## 2020-06-21 DIAGNOSIS — Z20828 Contact with and (suspected) exposure to other viral communicable diseases: Secondary | ICD-10-CM | POA: Diagnosis not present

## 2020-07-08 DIAGNOSIS — Z23 Encounter for immunization: Secondary | ICD-10-CM | POA: Diagnosis not present

## 2020-08-04 DIAGNOSIS — U071 COVID-19: Secondary | ICD-10-CM | POA: Diagnosis not present

## 2020-08-10 DIAGNOSIS — U071 COVID-19: Secondary | ICD-10-CM | POA: Diagnosis not present

## 2020-10-13 DIAGNOSIS — Z779 Other contact with and (suspected) exposures hazardous to health: Secondary | ICD-10-CM | POA: Diagnosis not present

## 2020-10-13 DIAGNOSIS — Z6820 Body mass index (BMI) 20.0-20.9, adult: Secondary | ICD-10-CM | POA: Diagnosis not present

## 2020-10-13 DIAGNOSIS — Z124 Encounter for screening for malignant neoplasm of cervix: Secondary | ICD-10-CM | POA: Diagnosis not present

## 2020-11-10 DIAGNOSIS — R922 Inconclusive mammogram: Secondary | ICD-10-CM | POA: Diagnosis not present

## 2020-11-10 DIAGNOSIS — N6081 Other benign mammary dysplasias of right breast: Secondary | ICD-10-CM | POA: Diagnosis not present

## 2020-12-07 DIAGNOSIS — H524 Presbyopia: Secondary | ICD-10-CM | POA: Diagnosis not present

## 2020-12-07 DIAGNOSIS — H2513 Age-related nuclear cataract, bilateral: Secondary | ICD-10-CM | POA: Diagnosis not present

## 2020-12-07 DIAGNOSIS — H5203 Hypermetropia, bilateral: Secondary | ICD-10-CM | POA: Diagnosis not present

## 2020-12-07 DIAGNOSIS — H43813 Vitreous degeneration, bilateral: Secondary | ICD-10-CM | POA: Diagnosis not present

## 2020-12-18 DIAGNOSIS — Z23 Encounter for immunization: Secondary | ICD-10-CM | POA: Diagnosis not present

## 2020-12-28 DIAGNOSIS — Z20828 Contact with and (suspected) exposure to other viral communicable diseases: Secondary | ICD-10-CM | POA: Diagnosis not present

## 2020-12-28 DIAGNOSIS — Z20822 Contact with and (suspected) exposure to covid-19: Secondary | ICD-10-CM | POA: Diagnosis not present

## 2020-12-30 DIAGNOSIS — Z20828 Contact with and (suspected) exposure to other viral communicable diseases: Secondary | ICD-10-CM | POA: Diagnosis not present

## 2021-01-07 DIAGNOSIS — Z23 Encounter for immunization: Secondary | ICD-10-CM | POA: Diagnosis not present

## 2021-01-20 DIAGNOSIS — F900 Attention-deficit hyperactivity disorder, predominantly inattentive type: Secondary | ICD-10-CM | POA: Diagnosis not present

## 2021-01-20 DIAGNOSIS — Z1389 Encounter for screening for other disorder: Secondary | ICD-10-CM | POA: Diagnosis not present

## 2021-01-20 DIAGNOSIS — Z79899 Other long term (current) drug therapy: Secondary | ICD-10-CM | POA: Diagnosis not present

## 2021-01-20 DIAGNOSIS — G2581 Restless legs syndrome: Secondary | ICD-10-CM | POA: Diagnosis not present

## 2021-01-20 DIAGNOSIS — H9190 Unspecified hearing loss, unspecified ear: Secondary | ICD-10-CM | POA: Diagnosis not present

## 2021-02-03 DIAGNOSIS — H903 Sensorineural hearing loss, bilateral: Secondary | ICD-10-CM | POA: Diagnosis not present

## 2021-10-14 DIAGNOSIS — Z8542 Personal history of malignant neoplasm of other parts of uterus: Secondary | ICD-10-CM | POA: Diagnosis not present

## 2021-10-14 DIAGNOSIS — Z6820 Body mass index (BMI) 20.0-20.9, adult: Secondary | ICD-10-CM | POA: Diagnosis not present

## 2021-10-14 DIAGNOSIS — Z779 Other contact with and (suspected) exposures hazardous to health: Secondary | ICD-10-CM | POA: Diagnosis not present

## 2021-10-14 DIAGNOSIS — Z1272 Encounter for screening for malignant neoplasm of vagina: Secondary | ICD-10-CM | POA: Diagnosis not present

## 2021-11-04 DIAGNOSIS — C50919 Malignant neoplasm of unspecified site of unspecified female breast: Secondary | ICD-10-CM

## 2021-11-04 HISTORY — DX: Malignant neoplasm of unspecified site of unspecified female breast: C50.919

## 2021-11-14 DIAGNOSIS — Z1231 Encounter for screening mammogram for malignant neoplasm of breast: Secondary | ICD-10-CM | POA: Diagnosis not present

## 2021-11-22 DIAGNOSIS — N6315 Unspecified lump in the right breast, overlapping quadrants: Secondary | ICD-10-CM | POA: Diagnosis not present

## 2021-11-22 DIAGNOSIS — R928 Other abnormal and inconclusive findings on diagnostic imaging of breast: Secondary | ICD-10-CM | POA: Diagnosis not present

## 2021-11-22 DIAGNOSIS — R922 Inconclusive mammogram: Secondary | ICD-10-CM | POA: Diagnosis not present

## 2021-11-23 ENCOUNTER — Other Ambulatory Visit: Payer: Self-pay

## 2021-11-23 DIAGNOSIS — C50811 Malignant neoplasm of overlapping sites of right female breast: Secondary | ICD-10-CM | POA: Diagnosis not present

## 2021-11-24 ENCOUNTER — Telehealth: Payer: Self-pay | Admitting: Hematology and Oncology

## 2021-11-24 ENCOUNTER — Encounter: Payer: Self-pay | Admitting: Geriatric Medicine

## 2021-11-24 DIAGNOSIS — Z23 Encounter for immunization: Secondary | ICD-10-CM | POA: Diagnosis not present

## 2021-11-24 NOTE — Telephone Encounter (Signed)
Patient called back to confirm morning clinic appointment for 9/27

## 2021-11-24 NOTE — Telephone Encounter (Signed)
LVM  for patient to return call in reference to the [facility] on 9/27

## 2021-11-28 ENCOUNTER — Encounter: Payer: Self-pay | Admitting: *Deleted

## 2021-11-28 DIAGNOSIS — C50411 Malignant neoplasm of upper-outer quadrant of right female breast: Secondary | ICD-10-CM | POA: Insufficient documentation

## 2021-11-29 NOTE — Progress Notes (Signed)
Radiation Oncology         [phone] ________________________________  Initial Outpatient Consultation  Name: Megan Phillips MRN: [id number]  Date: 11/30/2021  DOB: Oct 06, 1950  BM:WUXLKGMWN, Christiane Ha, MD  Erroll Luna, MD , Dr. Dian Queen  REFERRING PHYSICIAN: Erroll Luna, MD  DIAGNOSIS:    ICD-10-CM   1. Malignant neoplasm of upper-outer quadrant of right breast in female, estrogen receptor positive ([place])  C50.411    Z17.0       Cancer Staging  Malignant neoplasm of upper-outer quadrant of right breast in female, estrogen receptor positive ([place]) Staging form: Breast, AJCC 8th Edition - Clinical stage from 11/30/2021: Stage IA (cT1c, cN0, cM0, G2, ER+, PR+, HER2-) - Signed by Benay Pike, MD on 11/30/2021 Stage prefix: Initial diagnosis Histologic grading system: 3 grade system Laterality: Right Staged by: Pathologist and managing physician Stage used in treatment planning: Yes National guidelines used in treatment planning: Yes Type of national guideline used in treatment planning: NCCN     CHIEF COMPLAINT: Here to discuss management of right breast cancer  HISTORY OF PRESENT ILLNESS::Megan Phillips is a 71 y.o. female who presented with a right breast abnormality on the following imaging: bilateral screening mammogram on the date of 11/14/21. No symptoms, if any, were reported at that time.   Right breast diagnostic mammogram and right breast ultrasound on 11/22/21 showed a 1.9 cm irregular mass in the 12 o'clock right breast, 4 cmfn, highly concerning for malignancy. No abnormal right axillary lymph nodes were appreciated.   Biopsy of the 12 o'clock right breast on date of 11/23/21 showed grade 2 invasive ductal carcinoma measuring 0.9 cm in the greatest linear extent.  ER status: 100% positive and PR status 100% positive, both with strong staining intensity; Proliferation marker Ki67 at 10%; Her2 status negative; Grade 2. No lymph nodes were examined.    Of note: Patient has a history significant for grade 1 endometrioid adenocarcinoma in 2011, s/p hysterectomy, BSO, and pelvic lymph node dissection. She did not receive any adjuvant treatment for this.   She is a retired Investment banker, operational.  She is here with her husband who is also a Engineer, water and he appears to be very supportive.  She reports that about 30 years ago she had an excisional biopsy of the right breast that was benign  PREVIOUS RADIATION THERAPY: No  PAST MEDICAL HISTORY:  has a past medical history of Endometrioid adenocarcinoma (06/2009).    PAST SURGICAL HISTORY: Past Surgical History:  Procedure Laterality Date   ABDOMINAL HYSTERECTOMY  06/2009   Robotic Hyst, BSO, pelvic lymph node dissection    FAMILY HISTORY: family history includes Heart disease in her brother, father, and mother; Hyperlipidemia in her mother; Stroke in her mother; Uterine cancer in her mother.  SOCIAL HISTORY:  reports that she has never smoked. She does not have any smokeless tobacco history on file. She reports current alcohol use. She reports that she does not use drugs.  ALLERGIES: Patient has no known allergies.  MEDICATIONS:  Current Outpatient Medications  Medication Sig Dispense Refill   estradiol (VIVELLE-DOT) 0.05 MG/24HR patch      fexofenadine (ALLEGRA ALLERGY) 180 MG tablet Take 1 tablet every day by oral route.     methylphenidate (RITALIN) 5 MG tablet      sertraline (ZOLOFT) 25 MG tablet Take 25 mg by mouth daily.     No current facility-administered medications for this encounter.    REVIEW OF SYSTEMS: As above in HPI.   PHYSICAL  EXAM:  vitals were not taken for this visit.   General: Alert and oriented, in no acute distress HEENT: Head is normocephalic. Extraocular movements are intact.   Heart: Regular in rate and rhythm with no murmurs, rubs, or gallops. Chest: Clear to auscultation bilaterally, with no rhonchi, wheezes, or rales. Abdomen: Soft, nontender,  nondistended, with no rigidity or guarding. Extremities: No cyanosis or edema. Lymphatics: No axillary adenopathy Skin: No concerning lesions.  Bruising at biopsy site of right breast Musculoskeletal: symmetric strength and muscle tone throughout. Neurologic: Cranial nerves II through XII are grossly intact. No obvious focalities. Speech is fluent. Coordination is intact. Psychiatric: Judgment and insight are intact. Affect is appropriate. Breasts: Upper outer quadrant of the right breast has postbiopsy bruising and a mass that is approximately 1.5 cm. No other palpable masses appreciated in the breasts or axillae bilaterally.  There is a well healed excisional biopsy scar in the central upper right breast.    ECOG = 0  0 - Asymptomatic (Fully active, able to carry on all predisease activities without restriction)  1 - Symptomatic but completely ambulatory (Restricted in physically strenuous activity but ambulatory and able to carry out work of a light or sedentary nature. For example, light housework, office work)  2 - Symptomatic, <50% in bed during the day (Ambulatory and capable of all self care but unable to carry out any work activities. Up and about more than 50% of waking hours)  3 - Symptomatic, >50% in bed, but not bedbound (Capable of only limited self-care, confined to bed or chair 50% or more of waking hours)  4 - Bedbound (Completely disabled. Cannot carry on any self-care. Totally confined to bed or chair)  5 - Death   Eustace Pen MM, Creech RH, Tormey DC, et al. [phone]). "Toxicity and response criteria of the [facility]". [place] Oncol. 5 (6): 649-55   LABORATORY DATA:  Lab Results  Component Value Date   WBC 6.7 11/30/2021   HGB 13.1 11/30/2021   HCT 38.3 11/30/2021   MCV 90.1 11/30/2021   PLT 334 11/30/2021   CMP     Component Value Date/Time   NA 137 11/30/2021 0818   K 4.1 11/30/2021 0818   CL 106 11/30/2021 0818   CO2 27 11/30/2021  0818   GLUCOSE 91 11/30/2021 0818   BUN 14 11/30/2021 0818   CREATININE 0.78 11/30/2021 0818   CALCIUM 9.1 11/30/2021 0818   PROT 6.7 11/30/2021 0818   ALBUMIN 4.0 11/30/2021 0818   AST 16 11/30/2021 0818   ALT 10 11/30/2021 0818   ALKPHOS 66 11/30/2021 0818   BILITOT 0.5 11/30/2021 0818   GFRNONAA >60 11/30/2021 0818        RADIOGRAPHY: As above    IMPRESSION/PLAN: This is a very pleasant 71 year old woman with stage I ER positive right breast cancer  She anticipates breast conserving surgery.  She is still deciding whether she will want to undergo a lymph node biopsy in addition to her lumpectomy  It was a pleasure meeting the patient today. We discussed the risks, benefits, and side effects of adjuvant radiotherapy.  We will radiation therapy would not improve her life expectancy, I think it is a very reasonable option to optimize her prognosis and reduce the risk of an in breast recurrence later in life.   I recommend radiotherapy to the right breast to reduce her risk of locoregional recurrence by 2/3.  We discussed that radiation would take approximately 4 weeks to  complete and that I would give the patient a few weeks to heal following surgery before starting treatment planning.    I let patient and Dr. Brantley Stage know that if she does not undergo lymph node biopsy I will treat with high tangents to cover the highest risk axillary nodes as well.   We spoke about acute effects including skin irritation and fatigue as well as much less common late effects including internal organ injury or irritation. We spoke about the latest technology that is used to minimize the risk of late effects for patients undergoing radiotherapy to the breast or chest wall. No guarantees of treatment were given. The patient is enthusiastic about proceeding with treatment. I look forward to participating in the patient's care.  I will await her referral back to me for postoperative follow-up and eventual CT  simulation/treatment planning.  On date of service, in total, I spent 50 minutes on this encounter. Patient was seen in person.   __________________________________________   Eppie Gibson, MD  This document serves as a record of services personally performed by Eppie Gibson, MD. It was created on her behalf by Roney Mans, a trained medical scribe. The creation of this record is based on the scribe's personal observations and the provider's statements to them. This document has been checked and approved by the attending provider.

## 2021-11-30 ENCOUNTER — Encounter: Payer: Self-pay | Admitting: *Deleted

## 2021-11-30 ENCOUNTER — Ambulatory Visit
Admission: RE | Admit: 2021-11-30 | Discharge: 2021-11-30 | Disposition: A | Discharge status: Home / Self Care | Payer: Medicare Other | Source: Ambulatory Visit | Attending: Radiation Oncology | Admitting: Radiation Oncology

## 2021-11-30 ENCOUNTER — Encounter: Payer: Self-pay | Admitting: Hematology and Oncology

## 2021-11-30 ENCOUNTER — Inpatient Hospital Stay: Payer: Medicare Other

## 2021-11-30 ENCOUNTER — Encounter: Payer: Self-pay | Admitting: Genetic Counselor

## 2021-11-30 ENCOUNTER — Inpatient Hospital Stay: Payer: Medicare Other | Admitting: Licensed Clinical Social Worker

## 2021-11-30 ENCOUNTER — Inpatient Hospital Stay: Payer: Medicare Other | Attending: Hematology and Oncology | Admitting: Hematology and Oncology

## 2021-11-30 ENCOUNTER — Other Ambulatory Visit: Payer: Self-pay

## 2021-11-30 ENCOUNTER — Inpatient Hospital Stay (HOSPITAL_BASED_OUTPATIENT_CLINIC_OR_DEPARTMENT_OTHER): Payer: Medicare Other | Admitting: Genetic Counselor

## 2021-11-30 ENCOUNTER — Other Ambulatory Visit: Payer: Self-pay | Admitting: *Deleted

## 2021-11-30 ENCOUNTER — Encounter: Payer: Self-pay | Admitting: Radiation Oncology

## 2021-11-30 ENCOUNTER — Ambulatory Visit: Payer: Medicare Other | Admitting: Physical Therapy

## 2021-11-30 ENCOUNTER — Ambulatory Visit: Payer: Self-pay | Admitting: Surgery

## 2021-11-30 DIAGNOSIS — Z170 Estrogen receptor positive status [ER+]: Secondary | ICD-10-CM

## 2021-11-30 DIAGNOSIS — C50411 Malignant neoplasm of upper-outer quadrant of right female breast: Secondary | ICD-10-CM | POA: Diagnosis not present

## 2021-11-30 DIAGNOSIS — Z9079 Acquired absence of other genital organ(s): Secondary | ICD-10-CM | POA: Diagnosis not present

## 2021-11-30 DIAGNOSIS — Z90710 Acquired absence of both cervix and uterus: Secondary | ICD-10-CM | POA: Diagnosis not present

## 2021-11-30 DIAGNOSIS — Z8049 Family history of malignant neoplasm of other genital organs: Secondary | ICD-10-CM | POA: Diagnosis not present

## 2021-11-30 DIAGNOSIS — Z8542 Personal history of malignant neoplasm of other parts of uterus: Secondary | ICD-10-CM

## 2021-11-30 DIAGNOSIS — Z79899 Other long term (current) drug therapy: Secondary | ICD-10-CM | POA: Diagnosis not present

## 2021-11-30 DIAGNOSIS — Z90722 Acquired absence of ovaries, bilateral: Secondary | ICD-10-CM | POA: Insufficient documentation

## 2021-11-30 DIAGNOSIS — Z853 Personal history of malignant neoplasm of breast: Secondary | ICD-10-CM | POA: Diagnosis not present

## 2021-11-30 DIAGNOSIS — C50911 Malignant neoplasm of unspecified site of right female breast: Secondary | ICD-10-CM

## 2021-11-30 DIAGNOSIS — Z803 Family history of malignant neoplasm of breast: Secondary | ICD-10-CM | POA: Diagnosis not present

## 2021-11-30 DIAGNOSIS — Z809 Family history of malignant neoplasm, unspecified: Secondary | ICD-10-CM | POA: Diagnosis not present

## 2021-11-30 HISTORY — DX: Personal history of malignant neoplasm of other parts of uterus: Z85.42

## 2021-11-30 HISTORY — DX: Family history of malignant neoplasm of breast: Z80.3

## 2021-11-30 LAB — CMP (CANCER CENTER ONLY)
ALT: 10 U/L (ref 0–44)
AST: 16 U/L (ref 15–41)
Albumin: 4 g/dL (ref 3.5–5.0)
Alkaline Phosphatase: 66 U/L (ref 38–126)
Anion gap: 4 — ABNORMAL LOW (ref 5–15)
BUN: 14 mg/dL (ref 8–23)
CO2: 27 mmol/L (ref 22–32)
Calcium: 9.1 mg/dL (ref 8.9–10.3)
Chloride: 106 mmol/L (ref 98–111)
Creatinine: 0.78 mg/dL (ref 0.44–1.00)
GFR, Estimated: >60 mL/min (ref >60)
Glucose, Bld: 91 mg/dL (ref 70–99)
Potassium: 4.1 mmol/L (ref 3.5–5.1)
Sodium: 137 mmol/L (ref 135–145)
Total Bilirubin: 0.5 mg/dL (ref 0.3–1.2)
Total Protein: 6.7 g/dL (ref 6.5–8.1)

## 2021-11-30 LAB — CBC WITH DIFFERENTIAL (CANCER CENTER ONLY)
Abs Immature Granulocytes: 0.02 10*3/uL (ref 0.00–0.07)
Basophils Absolute: 0.1 10*3/uL (ref 0.0–0.1)
Basophils Relative: 1 %
Eosinophils Absolute: 0.3 10*3/uL (ref 0.0–0.5)
Eosinophils Relative: 4 %
HCT: 38.3 % (ref 36.0–46.0)
Hemoglobin: 13.1 g/dL (ref 12.0–15.0)
Immature Granulocytes: 0 %
Lymphocytes Relative: 23 %
Lymphs Abs: 1.5 10*3/uL (ref 0.7–4.0)
MCH: 30.8 pg (ref 26.0–34.0)
MCHC: 34.2 g/dL (ref 30.0–36.0)
MCV: 90.1 fL (ref 80.0–100.0)
Monocytes Absolute: 0.6 10*3/uL (ref 0.1–1.0)
Monocytes Relative: 9 %
Neutro Abs: 4.3 10*3/uL (ref 1.7–7.7)
Neutrophils Relative %: 63 %
Platelet Count: 334 10*3/uL (ref 150–400)
RBC: 4.25 MIL/uL (ref 3.87–5.11)
RDW: 12.4 % (ref 11.5–15.5)
WBC Count: 6.7 10*3/uL (ref 4.0–10.5)
nRBC: 0 % (ref 0.0–0.2)

## 2021-11-30 LAB — RESEARCH LABS

## 2021-11-30 LAB — GENETIC SCREENING ORDER

## 2021-11-30 NOTE — Research (Signed)
[employer] 2021-05 - Specimen Collection Study to Evaluate Biomarkers in Subjects with Cancer    Patient Megan Phillips was identified by Dr. Chryl Heck as a potential candidate for the above listed study.  This Clinical Research Nurse met with Megan Phillips, [id number], on 11/30/21 in a manner and location that ensures patient privacy to discuss participation in the above listed research study.  Patient is Unaccompanied.  A copy of the informed consent document with embedded HIPAA language was provided to the patient.  Patient reads, speaks, and understands [place].    Patient was provided with the business card of this Nurse and encouraged to contact the research team with any questions.  Patient was provided the option of taking informed consent documents home to review and was encouraged to review at their convenience with their support network, including other care providers. Patient is comfortable with making a decision regarding study participation today.  As outlined in the informed consent form, this Nurse and Jomarie Longs discussed the purpose of the research study, the investigational nature of the study, study procedures and requirements for study participation, potential risks and benefits of study participation, as well as alternatives to participation. This study is not blinded. The patient understands participation is voluntary and they may withdraw from study participation at any time.  This study does not involve randomization.  This study does not involve an investigational drug or device. This study does not involve a placebo. Patient understands enrollment is pending full eligibility review.   Confidentiality and how the patient's information will be used as part of study participation were discussed.  Patient was informed there is reimbursement provided for their time and effort spent on trial participation.  The patient is encouraged to discuss research study  participation with their insurance provider to determine what costs they may incur as part of study participation, including research related injury.    All questions were answered to patient's satisfaction.  The informed consent with embedded HIPAA language was reviewed page by page.  The patient's mental and emotional status is appropriate to provide informed consent, and the patient verbalizes an understanding of study participation.  Patient has agreed to participate in the above listed research study and has voluntarily signed the informed consent [place] IRB approved version [id number] (revised [phone]) with embedded HIPAA language, version [id number] (revised [id number])  on 11/30/21 at 11:00AM.  The patient was provided with a copy of the signed informed consent form with embedded HIPAA language for their reference.  No study specific procedures were obtained prior to the signing of the informed consent document.  Approximately 25 minutes were spent with the patient reviewing the informed consent documents.  Patient was not requested to complete a Release of Information form.   Eligibility:  This Nurse has reviewed this patient's inclusion and exclusion criteria with the patient, and this nurse confirmed Megan Phillips is eligible for study participation. 2nd eligibility confirmed by Carol Ada, study coordinator.  Eligibility confirmed by treating investigator, Dr. Chryl Heck, who also agrees that patient should proceed with enrollment.   Medical History Data:  The research nurse asked the pt the following medical questions after the pt signed the consent form.  The pt provided the following answers.    Menopausal status (women only): Megan Phillips is post menopausal with LMP around year "2000".   Medical History:  High Blood Pressure  No Coronary Artery Disease No Lupus    No Rheumatoid Arthritis  No Diabetes  No      Lynch Syndrome  No  Is the patient currently taking a magnesium  supplement?   No  Does the patient have a personal history of cancer (greater than 5 years ago)?  Yes If yes, Cancer type and date of diagnosis?   Uterine cancer, Feb. 2011  Has this previous diagnosis been treated? Yes,      If so, treatment type?  full hysterectomy - no chemo and no radiation Start and end dates of last treatment cycle? Date of surgery 06/04/2009  Does the patient have a family history of cancer in 1st or 2nd degree relatives? No  Does the patient have history of alcohol consumption? Yes   If yes, current or former? Current  Number of years? 50  Drinks per week? 7 glasses of wine  Does the patient have history of cigarette, cigar, pipe, or chewing tobacco use?  No   Blood Collection: Research blood obtained by venipuncture.  Patient tolerated the blood draw well without any adverse events.    Gift Card:  $50 gift card given to the pt by Remer Macho, research specialist.    The pt was thanked for her participation in this data and specimen study.  The research nurse will request her tumor block to submit to the study and enter her required study data.    Brion Aliment RN, BSN, CCRP Clinical Research Nurse Lead 11/30/2021 2:15 PM

## 2021-11-30 NOTE — Progress Notes (Signed)
[facility] CONSULT NOTE  Patient Care Team: Megan Manes, MD as PCP - General (Internal Medicine) Megan Luna, MD as Consulting Physician (General Surgery) Megan Pike, MD as Consulting Physician (Hematology and Oncology) Megan Gibson, MD as Attending Physician (Radiation Oncology) Megan Kaufmann, RN as Oncology Nurse Navigator Megan Germany, RN as Oncology Nurse Navigator  CHIEF COMPLAINTS/PURPOSE OF CONSULTATION:  Newly diagnosed breast cancer  HISTORY OF PRESENTING ILLNESS:  Megan Phillips 71 y.o. female is here because of recent diagnosis of right breast IDC  I reviewed her records extensively and collaborated the history with the patient.  SUMMARY OF ONCOLOGIC HISTORY: Oncology History  Malignant neoplasm of upper-outer quadrant of right breast in female, estrogen receptor positive ([place])  11/14/2021 Mammogram   Screening mammogram showed possible mass in the right breast, indeterminate.  Diagnostic mammogram showed a 1.7 cm irregular mass in the right breast suspicious of malignancy.   11/23/2021 Pathology Results   Pathology from the right breast needle core biopsy showed invasive ductal carcinoma, grade 2, prognostic showed ER 100% positive strong staining PR 100% positive strong staining, Ki-67 of 10% and HER2 of 1+   11/28/2021 Initial Diagnosis   Malignant neoplasm of upper-outer quadrant of right breast in female, estrogen receptor positive ([place])   11/30/2021 Cancer Staging   Staging form: Breast, AJCC 8th Edition - Clinical stage from 11/30/2021: Stage IA (cT1c, cN0, cM0, G2, ER+, PR+, HER2-) - Signed by Megan Pike, MD on 11/30/2021 Stage prefix: Initial diagnosis Histologic grading system: 3 grade system Laterality: Right Staged by: Pathologist and managing physician Stage used in treatment planning: Yes National guidelines used in treatment planning: Yes Type of national guideline used in treatment planning: NCCN    Megan Phillips  is here for an initial visit with her husband.  She is a retired Engineer, water, retired about 3 months ago.  She has been on long-term hormone replacement therapy for almost 30 years.  She just took her last patch about 2 days ago.  She otherwise denies any complaints.  She is healthy at baseline, eats healthy, exercises regularly.  No family history of breast cancer.  History of grade 1 Mom also.  Denies any history of smoking drinks about 8 glasses of week. Rest of the pertinent 10 point ROS reviewed and negative  MEDICAL HISTORY:  Past Medical History:  Diagnosis Date   Endometrioid adenocarcinoma 06/2009   Stage 1A grade 1    SURGICAL HISTORY: Past Surgical History:  Procedure Laterality Date   ABDOMINAL HYSTERECTOMY  06/2009   Robotic Hyst, BSO, pelvic lymph node dissection    SOCIAL HISTORY: Social History   Socioeconomic History   Marital status: Married    Spouse name: Not on file   Number of children: Not on file   Years of education: Not on file   Highest education level: Not on file  Occupational History   Not on file  Tobacco Use   Smoking status: Never   Smokeless tobacco: Not on file  Substance and Sexual Activity   Alcohol use: Yes    Comment: Few glasses of wine a week   Drug use: Never   Sexual activity: Not on file  Other Topics Concern   Not on file  Social History Narrative   Not on file   Social Determinants of Health   Financial Resource Strain: Low Risk  (11/30/2021)   Overall Financial Resource Strain (CARDIA)    Difficulty of Paying Living Expenses: Not hard at all  Food Insecurity: No Food Insecurity (11/30/2021)   Hunger Vital Sign    Worried About Running Out of Food in the Last Year: Never true    Ran Out of Food in the Last Year: Never true  Transportation Needs: No Transportation Needs (11/30/2021)   PRAPARE - Hydrologist (Medical): No    Lack of Transportation (Non-Medical): No  Physical Activity: Not on  file  Stress: Not on file  Social Connections: Not on file  Intimate Partner Violence: Not on file    FAMILY HISTORY: Family History  Problem Relation Age of Onset   Hyperlipidemia Mother    Heart disease Mother    Stroke Mother    Uterine cancer Mother    Heart disease Father    Heart disease Brother     ALLERGIES:  has No Known Allergies.  MEDICATIONS:  Current Outpatient Medications  Medication Sig Dispense Refill   estradiol (VIVELLE-DOT) 0.05 MG/24HR patch      fexofenadine (ALLEGRA ALLERGY) 180 MG tablet Take 1 tablet every day by oral route.     methylphenidate (RITALIN) 5 MG tablet      sertraline (ZOLOFT) 25 MG tablet Take 25 mg by mouth daily.     No current facility-administered medications for this visit.    REVIEW OF SYSTEMS:   Constitutional: Denies fevers, chills or abnormal night sweats Eyes: Denies blurriness of vision, double vision or watery eyes Ears, nose, mouth, throat, and face: Denies mucositis or sore throat Respiratory: Denies cough, dyspnea or wheezes Cardiovascular: Denies palpitation, chest discomfort or lower extremity swelling Gastrointestinal:  Denies nausea, heartburn or change in bowel habits Skin: Denies abnormal skin rashes Lymphatics: Denies new lymphadenopathy or easy bruising Neurological:Denies numbness, tingling or new weaknesses Behavioral/Psych: Mood is stable, no new changes  Breast: Denies any palpable lumps or discharge All other systems were reviewed with the patient and are negative.  PHYSICAL EXAMINATION: ECOG PERFORMANCE STATUS: 0 - Asymptomatic  Vitals:   11/30/21 0849  BP: 116/69  Pulse: (!) 58  Resp: 18  Temp: 99 F (37.2 C)  SpO2: 100%   Filed Weights   11/30/21 0849  Weight: 112 lb 14.4 oz (51.2 kg)    GENERAL:alert, no distress and comfortable SKIN: skin color, texture, turgor are normal, no rashes or significant lesions EYES: normal, conjunctiva are pink and non-injected, sclera  clear OROPHARYNX:no exudate, no erythema and lips, buccal mucosa, and tongue normal  NECK: supple, thyroid normal size, non-tender, without nodularity LYMPH:  no palpable lymphadenopathy in the cervical, axillary or inguinal LUNGS: clear to auscultation and percussion with normal breathing effort HEART: regular rate & rhythm and no murmurs and no lower extremity edema ABDOMEN:abdomen soft, non-tender and normal bowel sounds Musculoskeletal:no cyanosis of digits and no clubbing  PSYCH: alert & oriented x 3 with fluent speech NEURO: no focal motor/sensory deficits BREAST: Postbiopsy changes in the right breast lower outer quadrant  LABORATORY DATA:  I have reviewed the data as listed Lab Results  Component Value Date   WBC 6.7 11/30/2021   HGB 13.1 11/30/2021   HCT 38.3 11/30/2021   MCV 90.1 11/30/2021   PLT 334 11/30/2021   Lab Results  Component Value Date   NA 137 11/30/2021   K 4.1 11/30/2021   CL 106 11/30/2021   CO2 27 11/30/2021    RADIOGRAPHIC STUDIES: I have personally reviewed the radiological reports and agreed with the findings in the report.  ASSESSMENT AND PLAN:  Malignant neoplasm of upper-outer quadrant  of right breast in female, estrogen receptor positive ([place]) This is a pleasant 71 year old postmenopausal female patient on long-term hormone replacement therapy for the past 30 years now diagnosed with invasive ductal carcinoma, grade 2, ER/PR strongly positive, Ki-67 of 10% and HER2 of 1+ referred to breast [place] for additional recommendations.  We have discussed the pathology in detail today.  Given strong ER/PR positivity and small tumor, she will proceed with upfront surgery.  We have discussed about optional Oncotype testing.  It is very less likely that she will need adjuvant chemotherapy but she appeared to be interested in pursuing Oncotype testing.  We have discussed about Oncotype Dx score which is a well validated prognostic scoring system which can predict  outcome with endocrine therapy alone and whether chemotherapy reduces recurrence.  Typically in patients with ER positive cancers that are node negative if the RS score is high typically greater than or equal to 26, chemotherapy is recommended.  In women with intermediate recurrence score younger than 74, there can still be some role for chemotherapy in addition to endocrine therapy especially if the recurrence score is between 21-25. If chemotherapy is needed, this will precede radiation and then after radiation she will continue on antiestrogen therapy.   Following Oncotype testing, if she is not a candidate for adjuvant chemotherapy, she will proceed with adjuvant radiation followed by antiestrogen therapy.  We have discussed about both tamoxifen versus aromatase inhibitors.  At this time we are leaning towards aromatase inhibitors.  I discussed about mechanism of action of aromatase inhibitors, adverse effects including but not limited to fatigue, postmenopausal symptoms, arthralgias, vaginal dryness, bone loss etc. she may have a history of osteopenia/osteoporosis and took Fosamax for several years.  She has an upcoming bone density scan scheduled for October 2023.  She will return to clinic after surgery to review Oncotype results and to discuss additional recommendations.  Total time spent: 60 minutes including history, physical exam, review of records, counseling and coordination of care All questions were answered. The patient knows to call the clinic with any problems, questions or concerns.    Megan Pike, MD 11/30/21

## 2021-11-30 NOTE — Progress Notes (Signed)
[place] Work  Initial Assessment   Megan Phillips "Megan Phillips" is a 71 y.o. year old female presenting alone (husband was present during earlier visits with medical team). Clinical Social Work was referred by  [facility]  for assessment of psychosocial needs.   SDOH (Social Determinants of Health) assessments performed: Yes SDOH Interventions    Flowsheet Row Clinical Support from 11/30/2021 in [facility]  SDOH Interventions   Food Insecurity Interventions Intervention Not Indicated  Housing Interventions Intervention Not Indicated  Transportation Interventions Intervention Not Indicated  Utilities Interventions Intervention Not Indicated  Financial Strain Interventions Intervention Not Indicated       SDOH Screenings   Food Insecurity: No Food Insecurity (11/30/2021)  Housing: Low Risk  (11/30/2021)  Transportation Needs: No Transportation Needs (11/30/2021)  Utilities: Not At Risk (11/30/2021)  Financial Resource Strain: Low Risk  (11/30/2021)  Tobacco Use: Unknown (11/30/2021)     Distress Screen completed: Yes    11/30/2021   11:02 AM  ONCBCN DISTRESS SCREENING  Screening Type Initial Screening  Distress experienced in past week (1-10) 1  Emotional problem type Nervousness/Anxiety      Family/Social Information:  Housing Arrangement: patient lives with husband, Megan Phillips Family members/support persons in your life? 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  Income source: retirement income & savings Financial concerns: No Type of concern: None Food access concerns: no Religious or spiritual practice: Not known Services Currently in place:  already has advance directives  Coping/ Adjustment to diagnosis: Patient understands treatment plan and what happens next? yes, feels fortunate that she does not need all levels of treatment  Concerns about diagnosis and/or  treatment: I'm not especially worried about anything Patient reported stressors: Anxiety/ nervousness Current coping skills/ strengths: Ability for insight , Capable of independent living , Communication skills , Motivation for treatment/growth , Supportive family/friends , and Work skills     SUMMARY: Current SDOH Barriers:  None noted today  Clinical Social Work Clinical Goal(s):  No clinical social work goals at this time  Interventions: Discussed common feeling and emotions when being diagnosed with cancer, and the importance of support during treatment Informed patient of the support team roles and support services at [facility] Provided [place] contact information and encouraged patient to call with any questions or concerns   Follow Up Plan: Patient will contact CSW with any support or resource needs Patient verbalizes understanding of plan: Yes    Megan Phillips E Megan Gamm, LCSW

## 2021-11-30 NOTE — Research (Signed)
[employer] 2021-05 - Specimen Collection Study to Evaluate Biomarkers in Subjects with Cancer    11/30/2021  SECOND ELIGIBILITY:  This Coordinator has reviewed this patient's inclusion and exclusion criteria as a second review and confirms Megan Phillips is eligible for study participation.  Patient may continue with enrollment.   Carol Ada, RT(R)(T) Clinical Research Coordinator

## 2021-11-30 NOTE — Assessment & Plan Note (Signed)
This is a pleasant 71 year old postmenopausal female patient on long-term hormone replacement therapy for the past 30 years now diagnosed with invasive ductal carcinoma, grade 2, ER/PR strongly positive, Ki-67 of 10% and HER2 of 1+ referred to breast [facility] for additional recommendations.  We have discussed the pathology in detail today.  Given strong ER/PR positivity and small tumor, she will proceed with upfront surgery.  We have discussed about optional Oncotype testing.  It is very less likely that she will need adjuvant chemotherapy but she appeared to be interested in pursuing Oncotype testing.  We have discussed about Oncotype Dx score which is a well validated prognostic scoring system which can predict outcome with endocrine therapy alone and whether chemotherapy reduces recurrence.  Typically in patients with ER positive cancers that are node negative if the RS score is high typically greater than or equal to 26, chemotherapy is recommended.  In women with intermediate recurrence score younger than 23, there can still be some role for chemotherapy in addition to endocrine therapy especially if the recurrence score is between 21-25. If chemotherapy is needed, this will precede radiation and then after radiation she will continue on antiestrogen therapy.   Following Oncotype testing, if she is not a candidate for adjuvant chemotherapy, she will proceed with adjuvant radiation followed by antiestrogen therapy.  We have discussed about both tamoxifen versus aromatase inhibitors.  At this time we are leaning towards aromatase inhibitors.  I discussed about mechanism of action of aromatase inhibitors, adverse effects including but not limited to fatigue, postmenopausal symptoms, arthralgias, vaginal dryness, bone loss etc. she may have a history of osteopenia/osteoporosis and took Fosamax for several years.  She has an upcoming bone density scan scheduled for October 2023.  She will return to clinic after  surgery to review Oncotype results and to discuss additional recommendations.

## 2021-11-30 NOTE — Progress Notes (Signed)
REFERRING PROVIDER: Benay Pike, MD [place],  [place] [id number]  PRIMARY PROVIDER:  Lajean Manes, MD  PRIMARY REASON FOR VISIT:  1. Malignant neoplasm of upper-outer quadrant of right breast in female, estrogen receptor positive ([place])   2. History of endometrial cancer   3. Family history of uterine cancer   4. Family history of breast cancer     HISTORY OF PRESENT ILLNESS:   Megan Phillips, a 71 y.o. female, was seen for a [place] cancer genetics consultation at the request of Dr. Chryl Heck due to a personal history of breast and endometrial cancer.  Megan Phillips presents to clinic today to discuss the possibility of a hereditary predisposition to cancer, to discuss genetic testing, and to further clarify her future cancer risks, as well as potential cancer risks for family members.   In 2011, at the age of 66, Megan Phillips was diagnosed with grade I endometrial cancer (endometrioid adenocarcinoma) s/p hysterectomy and BSO.  No mismatch repair IHC results were available for review.  In September 2023, at the age of 57, Megan Phillips was diagnosed with invasive ductal carcinoma of the right breast (ER+/PR+/HER2-).  The treatment plan is pending.    CANCER HISTORY:  Oncology History  Malignant neoplasm of upper-outer quadrant of right breast in female, estrogen receptor positive ([place])  11/14/2021 Mammogram   Screening mammogram showed possible mass in the right breast, indeterminate.  Diagnostic mammogram showed a 1.7 cm irregular mass in the right breast suspicious of malignancy.   11/23/2021 Pathology Results   Pathology from the right breast needle core biopsy showed invasive ductal carcinoma, grade 2, prognostic showed ER 100% positive strong staining PR 100% positive strong staining, Ki-67 of 10% and HER2 of 1+   11/28/2021 Initial Diagnosis   Malignant neoplasm of upper-outer quadrant of right breast in female, estrogen receptor positive ([place])   11/30/2021 Cancer Staging    Staging form: Breast, AJCC 8th Edition - Clinical stage from 11/30/2021: Stage IA (cT1c, cN0, cM0, G2, ER+, PR+, HER2-) - Signed by Benay Pike, MD on 11/30/2021 Stage prefix: Initial diagnosis Histologic grading system: 3 grade system Laterality: Right Staged by: Pathologist and managing physician Stage used in treatment planning: Yes National guidelines used in treatment planning: Yes Type of national guideline used in treatment planning: NCCN     Past Medical History:  Diagnosis Date   Endometrioid adenocarcinoma 06/2009   Stage 1A grade 1   Family history of breast cancer 11/30/2021   History of endometrial cancer 11/30/2021    Past Surgical History:  Procedure Laterality Date   ABDOMINAL HYSTERECTOMY  06/2009   Robotic Hyst, BSO, pelvic lymph node dissection    FAMILY HISTORY:  We obtained a detailed, 4-generation family history.  Significant diagnoses are listed below: Family History  Problem Relation Age of Onset   Uterine cancer Mother 45       ? or other reason for hysterectomy   Breast cancer Maternal Aunt        dx 78s   Breast cancer Other        mat aunt's granddaughter; dx 51s     Megan Phillips is uanware of previous family history of genetic testing for hereditary cancer risks. There is no reported Ashkenazi Jewish ancestry. There is no known consanguinity.  GENETIC COUNSELING ASSESSMENT: Megan Phillips is a 71 y.o. female with a personal and family history of cancer which is somewhat suggestive of a hereditary cancer syndrome and predisposition to cancer given the presence  of related cancers in the family . We, therefore, discussed and recommended the following at today's visit.   DISCUSSION: We discussed that 5 - 10% of cancer is hereditary.  Most cases of hereditary breast cancer are associated with mutations in BRCA1/2.  Most hereditary cases of endometrial cancer are associated with Lynch syndrome. There are other genes that can be associated with hereditary  breast cancer syndrome and uterine cancer syndromes.  We discussed that testing is beneficial for several reasons including knowing how to follow individuals for their cancer risks and understanding if other family members could be at risk for cancer and allowing them to undergo genetic testing.   We reviewed the characteristics, features and inheritance patterns of hereditary cancer syndromes. We also discussed genetic testing, including the appropriate family members to test, the process of testing, insurance coverage and turn-around-time for results. We discussed the implications of a negative, positive, carrier and/or variant of uncertain significant result. We recommended Megan Phillips pursue genetic testing for a panel that includes genes associated with breast, uterine, and other cancers.   The CancerNext-Expanded gene panel offered by [facility] and includes sequencing, rearrangement, and RNA analysis for the following 77 genes: AIP, ALK, APC, ATM, AXIN2, BAP1, BARD1, BLM, BMPR1A, BRCA1, BRCA2, BRIP1, CDC73, CDH1, CDK4, CDKN1B, CDKN2A, CHEK2, CTNNA1, DICER1, FANCC, FH, FLCN, GALNT12, KIF1B, LZTR1, MAX, MEN1, MET, MLH1, MSH2, MSH3, MSH6, MUTYH, NBN, NF1, NF2, NTHL1, PALB2, PHOX2B, PMS2, POT1, PRKAR1A, PTCH1, PTEN, RAD51C, RAD51D, RB1, RECQL, RET, SDHA, SDHAF2, SDHB, SDHC, SDHD, SMAD4, SMARCA4, SMARCB1, SMARCE1, STK11, SUFU, TMEM127, TP53, TSC1, TSC2, VHL and XRCC2 (sequencing and deletion/duplication); EGFR, EGLN1, HOXB13, KIT, MITF, PDGFRA, POLD1, and POLE (sequencing only); EPCAM and GREM1 (deletion/duplication only).   Based on Ms. Heal's personal history of breast and endometrial cancer, she meets medical criteria for genetic testing.   PLAN: After considering the risks, benefits, and limitations, Megan Phillips provided informed consent to pursue genetic testing and the blood sample was sent to [facility] for analysis of the CancerNext-Expanded +RNAinsight Panel. Results should be  available within approximately 3 weeks' time, at which point they will be disclosed by telephone to Megan Phillips, as will any additional recommendations warranted by these results. Megan Phillips will receive a summary of her genetic counseling visit and a copy of her results once available. This information will also be available in Epic.   Megan Phillips questions were answered to her satisfaction today. Our contact information was provided should additional questions or concerns arise. Thank you for the referral and allowing [place] to share in the care of your patient.   [place] Urieta M. Joette Phillips, [place], [facility] Genetic Counselor [email or web address] (P) [phone]  The patient was seen for a total of 15 minutes in face-to-face genetic counseling.  The patient was seen alone.  Drs. Lindi Adie and/or Burr Medico were available to discuss this case as needed.    _______________________________________________________________________ For Office Staff:  Number of people involved in session: 1 Was an Intern/ student involved with case: no

## 2021-12-02 ENCOUNTER — Encounter: Payer: Self-pay | Admitting: *Deleted

## 2021-12-02 DIAGNOSIS — Z170 Estrogen receptor positive status [ER+]: Secondary | ICD-10-CM

## 2021-12-08 ENCOUNTER — Telehealth: Payer: Self-pay | Admitting: *Deleted

## 2021-12-08 ENCOUNTER — Encounter: Payer: Self-pay | Admitting: *Deleted

## 2021-12-08 DIAGNOSIS — H52203 Unspecified astigmatism, bilateral: Secondary | ICD-10-CM | POA: Diagnosis not present

## 2021-12-08 DIAGNOSIS — H2513 Age-related nuclear cataract, bilateral: Secondary | ICD-10-CM | POA: Diagnosis not present

## 2021-12-08 NOTE — Telephone Encounter (Signed)
Spoke with patient to follow up from [facility] 9/27 and assess navigation needs. Patient denies any questions or concerns at this time. Encouraged her to call should anything arise and reviewed appts.  Patient verbalized understanding.

## 2021-12-09 DIAGNOSIS — R21 Rash and other nonspecific skin eruption: Secondary | ICD-10-CM | POA: Diagnosis not present

## 2021-12-12 DIAGNOSIS — Z23 Encounter for immunization: Secondary | ICD-10-CM | POA: Diagnosis not present

## 2021-12-13 ENCOUNTER — Other Ambulatory Visit: Payer: Self-pay

## 2021-12-13 ENCOUNTER — Encounter (HOSPITAL_BASED_OUTPATIENT_CLINIC_OR_DEPARTMENT_OTHER): Payer: Self-pay | Admitting: Surgery

## 2021-12-14 ENCOUNTER — Encounter: Payer: Self-pay | Admitting: Genetic Counselor

## 2021-12-14 ENCOUNTER — Telehealth: Payer: Self-pay | Admitting: Genetic Counselor

## 2021-12-14 ENCOUNTER — Ambulatory Visit: Payer: Self-pay | Admitting: Genetic Counselor

## 2021-12-14 DIAGNOSIS — Z1379 Encounter for other screening for genetic and chromosomal anomalies: Secondary | ICD-10-CM

## 2021-12-14 DIAGNOSIS — Z803 Family history of malignant neoplasm of breast: Secondary | ICD-10-CM

## 2021-12-14 DIAGNOSIS — Z8049 Family history of malignant neoplasm of other genital organs: Secondary | ICD-10-CM

## 2021-12-14 DIAGNOSIS — Z8542 Personal history of malignant neoplasm of other parts of uterus: Secondary | ICD-10-CM

## 2021-12-14 DIAGNOSIS — Z170 Estrogen receptor positive status [ER+]: Secondary | ICD-10-CM

## 2021-12-14 NOTE — Telephone Encounter (Signed)
Revealed negative genetics and VUS in MSH3.    

## 2021-12-14 NOTE — Progress Notes (Signed)
HPI:   Ms. Sills was previously seen in the [place] clinic due to a personal and family history of cancer and concerns regarding a hereditary predisposition to cancer. Please refer to our prior cancer genetics clinic note for more information regarding our discussion, assessment and recommendations, at the time. Ms. Posner recent genetic test results were disclosed to her, as were recommendations warranted by these results. These results and recommendations are discussed in more detail below.  CANCER HISTORY:  In 2011, at the age of 1, Ms. Kratz was diagnosed with grade I endometrial cancer (endometrioid adenocarcinoma) s/p hysterectomy and BSO.  No mismatch repair IHC results were available for review.  In September 2023, at the age of 35, Ms. Bissette was diagnosed with invasive ductal carcinoma of the right breast (ER+/PR+/HER2-).    Oncology History  Malignant neoplasm of upper-outer quadrant of right breast in female, estrogen receptor positive ([place])  11/14/2021 Mammogram   Screening mammogram showed possible mass in the right breast, indeterminate.  Diagnostic mammogram showed a 1.7 cm irregular mass in the right breast suspicious of malignancy.   11/23/2021 Pathology Results   Pathology from the right breast needle core biopsy showed invasive ductal carcinoma, grade 2, prognostic showed ER 100% positive strong staining PR 100% positive strong staining, Ki-67 of 10% and HER2 of 1+   11/28/2021 Initial Diagnosis   Malignant neoplasm of upper-outer quadrant of right breast in female, estrogen receptor positive ([place])   11/30/2021 Cancer Staging   Staging form: Breast, AJCC 8th Edition - Clinical stage from 11/30/2021: Stage IA (cT1c, cN0, cM0, G2, ER+, PR+, HER2-) - Signed by Benay Pike, MD on 11/30/2021 Stage prefix: Initial diagnosis Histologic grading system: 3 grade system Laterality: Right Staged by: Pathologist and managing physician Stage used in treatment  planning: Yes National guidelines used in treatment planning: Yes Type of national guideline used in treatment planning: NCCN   12/13/2021 Genetic Testing   Negative genetic testing: no pathogenic variants detected in [facility] CancerNextExpanded +RNAinsight Panel.  VUS in MSH3 at  p.V682L (c.2044G>C).  Report date is 12/13/2021.   The CancerNext-Expanded gene panel offered by [facility] and includes sequencing, rearrangement, and RNA analysis for the following 77 genes: AIP, ALK, APC, ATM, AXIN2, BAP1, BARD1, BLM, BMPR1A, BRCA1, BRCA2, BRIP1, CDC73, CDH1, CDK4, CDKN1B, CDKN2A, CHEK2, CTNNA1, DICER1, FANCC, FH, FLCN, GALNT12, KIF1B, LZTR1, MAX, MEN1, MET, MLH1, MSH2, MSH3, MSH6, MUTYH, NBN, NF1, NF2, NTHL1, PALB2, PHOX2B, PMS2, POT1, PRKAR1A, PTCH1, PTEN, RAD51C, RAD51D, RB1, RECQL, RET, SDHA, SDHAF2, SDHB, SDHC, SDHD, SMAD4, SMARCA4, SMARCB1, SMARCE1, STK11, SUFU, TMEM127, TP53, TSC1, TSC2, VHL and XRCC2 (sequencing and deletion/duplication); EGFR, EGLN1, HOXB13, KIT, MITF, PDGFRA, POLD1, and POLE (sequencing only); EPCAM and GREM1 (deletion/duplication only).      FAMILY HISTORY:  We obtained a detailed, 4-generation family history.  Significant diagnoses are listed below:      Family History  Problem Relation Age of Onset   Uterine cancer Mother 50        ? or other reason for hysterectomy   Breast cancer Maternal Aunt          dx 4s   Breast cancer Other          mat aunt's granddaughter; dx 31s       Ms. Eaker is uanware of previous family history of genetic testing for hereditary cancer risks. There is no reported Ashkenazi Jewish ancestry. There is no known consanguinity.  GENETIC TEST RESULTS:  The [facility] CancerNextExpanded +RNAinsight Panel found  no pathogenic mutations.   The CancerNext-Expanded gene panel offered by [facility] and includes sequencing, rearrangement, and RNA analysis for the following 77 genes: AIP, ALK, APC, ATM, AXIN2, BAP1, BARD1, BLM, BMPR1A, BRCA1,  BRCA2, BRIP1, CDC73, CDH1, CDK4, CDKN1B, CDKN2A, CHEK2, CTNNA1, DICER1, FANCC, FH, FLCN, GALNT12, KIF1B, LZTR1, MAX, MEN1, MET, MLH1, MSH2, MSH3, MSH6, MUTYH, NBN, NF1, NF2, NTHL1, PALB2, PHOX2B, PMS2, POT1, PRKAR1A, PTCH1, PTEN, RAD51C, RAD51D, RB1, RECQL, RET, SDHA, SDHAF2, SDHB, SDHC, SDHD, SMAD4, SMARCA4, SMARCB1, SMARCE1, STK11, SUFU, TMEM127, TP53, TSC1, TSC2, VHL and XRCC2 (sequencing and deletion/duplication); EGFR, EGLN1, HOXB13, KIT, MITF, PDGFRA, POLD1, and POLE (sequencing only); EPCAM and GREM1 (deletion/duplication only).   The test report has been scanned into EPIC and is located under the Molecular Pathology section of the Results Review tab.  A portion of the result report is included below for reference. Genetic testing reported out on December 13, 2021.     Genetic testing identified a variant of uncertain significance (VUS) in the MSH3 gene called  p.V682L (c.2044G>C).  At this time, it is unknown if this variant is associated with an increased risk for cancer or if it is benign, but most uncertain variants are reclassified to benign. It should not be used to make medical management decisions. With time, we suspect the laboratory will determine the significance of this variant, if any. If the laboratory reclassifies this variant, we will attempt to contact Ms. Koehler to discuss it further.   Even though a pathogenic variant was not identified, possible explanations for the cancer in the family may include: There may be no hereditary risk for cancer in the family. The cancers in Ms. Lengyel and/or her family may be sporadic/familial or due to other genetic and environmental factors. There may be a gene mutation in one of these genes that current testing methods cannot detect but that chance is small. There could be another gene that has not yet been discovered, or that we have not yet tested, that is responsible for the cancer diagnoses in the family.  It is also possible there is a  hereditary cause for the cancer in the family that Ms. Ducksworth did not inherit.   Therefore, it is important to remain in touch with cancer genetics in the future so that we can continue to offer Ms. Manni the most up to date genetic testing.    ADDITIONAL GENETIC TESTING:  We discussed with Ms. Keay that her genetic testing was fairly extensive.  If there are additional relevant genes identified to increase cancer risk that can be analyzed in the future, we would be happy to discuss and coordinate this testing at that time.     CANCER SCREENING RECOMMENDATIONS:  Ms. Orser test result is considered negative (normal).  This means that we have not identified a hereditary cause for her personal history of breast or uterine at this time.   An individual's cancer risk and medical management are not determined by genetic test results alone. Overall cancer risk assessment incorporates additional factors, including personal medical history, family history, and any available genetic information that may result in a personalized plan for cancer prevention and surveillance. Therefore, it is recommended she continue to follow the cancer management and screening guidelines provided by her oncology and primary healthcare provider.  RECOMMENDATIONS FOR FAMILY MEMBERS:   Individuals in this family might be at some increased risk of developing cancer, over the general population risk, due to the family history of cancer.  Individuals in the family  should notify their providers of the family history of cancer. We recommend women in this family have a yearly mammogram beginning at age 40, or 61 years younger than the earliest onset of cancer, an annual clinical breast exam, and perform monthly breast self-exams.   Other members of the family may still carry a pathogenic variant in one of these genes that Ms. Barthel did not inherit. Based on the family history, we recommend her maternal first cousin's  daugther, who was diagnosed with breast cancer in her 30s, have genetic counseling and testing. Ms. Bartus can let us know if we can be of any assistance in coordinating genetic counseling and/or testing for this family member.   We do not recommend familial testing for the MSH3 variant of uncertain significance (VUS).  FOLLOW-UP:  Lastly, we discussed with Ms. Hinners that cancer genetics is a rapidly advancing field and it is possible that new genetic tests will be appropriate for her and/or her family members in the future. We encouraged her to remain in contact with cancer genetics on an annual basis so we can update her personal and family histories and let her know of advances in cancer genetics that may benefit this family.   Our contact number was provided. Ms. Brenton questions were answered to her satisfaction, and she knows she is welcome to call us at anytime with additional questions or concerns.   Rayme Bui M. Joette Catching, [place], [facility] Genetic Counselor [email or web address] (P) [phone]

## 2021-12-19 DIAGNOSIS — C50811 Malignant neoplasm of overlapping sites of right female breast: Secondary | ICD-10-CM | POA: Diagnosis not present

## 2021-12-19 NOTE — Progress Notes (Signed)

## 2021-12-19 NOTE — Anesthesia Preprocedure Evaluation (Signed)
Anesthesia Evaluation  Patient identified by MRN, date of birth, ID band Patient awake    Reviewed: Allergy & Precautions, NPO status , Patient's Chart, lab work & pertinent test results  History of Anesthesia Complications (+) PONV and history of anesthetic complications  Airway Mallampati: II  TM Distance: >3 FB Neck ROM: Full    Dental  (+) Poor Dentition   Pulmonary neg pulmonary ROS,    Pulmonary exam normal        Cardiovascular negative cardio ROS   Rhythm:Regular Rate:Normal     Neuro/Psych Anxiety Depression negative neurological ROS     GI/Hepatic negative GI ROS, Neg liver ROS,   Endo/Other  negative endocrine ROS  Renal/GU negative Renal ROS  negative genitourinary   Musculoskeletal Right breast Ca   Abdominal Normal abdominal exam  (+)   Peds  (+) ADHD Hematology negative hematology ROS (+)   Anesthesia Other Findings   Reproductive/Obstetrics                            Anesthesia Physical Anesthesia Plan  ASA: 2  Anesthesia Plan: General   Post-op Pain Management: Celebrex PO (pre-op)* and Tylenol PO (pre-op)*   Induction: Intravenous  PONV Risk Score and Plan: 4 or greater and Ondansetron, Dexamethasone, Treatment may vary due to age or medical condition and Midazolam  Airway Management Planned: Mask and LMA  Additional Equipment: None  Intra-op Plan:   Post-operative Plan: Extubation in OR  Informed Consent: I have reviewed the patients History and Physical, chart, labs and discussed the procedure including the risks, benefits and alternatives for the proposed anesthesia with the patient or authorized representative who has indicated his/her understanding and acceptance.     Dental advisory given  Plan Discussed with: CRNA  Anesthesia Plan Comments:        Anesthesia Quick Evaluation

## 2021-12-20 ENCOUNTER — Encounter (HOSPITAL_BASED_OUTPATIENT_CLINIC_OR_DEPARTMENT_OTHER)
Admission: RE | Disposition: A | Discharge status: Home / Self Care | Payer: Self-pay | Source: Ambulatory Visit | Attending: Surgery

## 2021-12-20 ENCOUNTER — Ambulatory Visit (HOSPITAL_BASED_OUTPATIENT_CLINIC_OR_DEPARTMENT_OTHER)
Admission: RE | Admit: 2021-12-20 | Discharge: 2021-12-20 | Disposition: A | Discharge status: Home / Self Care | Payer: Medicare Other | Source: Ambulatory Visit | Attending: Surgery | Admitting: Surgery

## 2021-12-20 ENCOUNTER — Ambulatory Visit (HOSPITAL_BASED_OUTPATIENT_CLINIC_OR_DEPARTMENT_OTHER): Payer: Medicare Other | Admitting: Anesthesiology

## 2021-12-20 ENCOUNTER — Other Ambulatory Visit: Payer: Self-pay

## 2021-12-20 ENCOUNTER — Encounter (HOSPITAL_BASED_OUTPATIENT_CLINIC_OR_DEPARTMENT_OTHER): Payer: Self-pay | Admitting: Surgery

## 2021-12-20 DIAGNOSIS — C50411 Malignant neoplasm of upper-outer quadrant of right female breast: Secondary | ICD-10-CM | POA: Insufficient documentation

## 2021-12-20 DIAGNOSIS — C541 Malignant neoplasm of endometrium: Secondary | ICD-10-CM | POA: Insufficient documentation

## 2021-12-20 DIAGNOSIS — F909 Attention-deficit hyperactivity disorder, unspecified type: Secondary | ICD-10-CM | POA: Insufficient documentation

## 2021-12-20 DIAGNOSIS — C50911 Malignant neoplasm of unspecified site of right female breast: Secondary | ICD-10-CM | POA: Diagnosis not present

## 2021-12-20 DIAGNOSIS — Z170 Estrogen receptor positive status [ER+]: Secondary | ICD-10-CM

## 2021-12-20 DIAGNOSIS — Z01818 Encounter for other preprocedural examination: Secondary | ICD-10-CM

## 2021-12-20 HISTORY — DX: Other specified postprocedural states: Z98.890

## 2021-12-20 HISTORY — DX: Depression, unspecified: F32.A

## 2021-12-20 HISTORY — DX: Other complications of anesthesia, initial encounter: T88.59XA

## 2021-12-20 HISTORY — PX: BREAST LUMPECTOMY WITH RADIOACTIVE SEED LOCALIZATION: SHX6424

## 2021-12-20 HISTORY — DX: Attention-deficit hyperactivity disorder, unspecified type: F90.9

## 2021-12-20 HISTORY — DX: Anxiety disorder, unspecified: F41.9

## 2021-12-20 SURGERY — BREAST LUMPECTOMY WITH RADIOACTIVE SEED LOCALIZATION
Anesthesia: General | Site: Breast | Laterality: Right

## 2021-12-20 MED ORDER — CEFAZOLIN SODIUM-DEXTROSE 2-3 GM-%(50ML) IV SOLR
INTRAVENOUS | Status: DC | PRN
  Administered 2021-12-20: 2 g via INTRAVENOUS

## 2021-12-20 MED ORDER — ONDANSETRON HCL 4 MG/2ML IJ SOLN
INTRAMUSCULAR | Status: DC | PRN
  Administered 2021-12-20: 4 mg via INTRAVENOUS

## 2021-12-20 MED ORDER — BUPIVACAINE-EPINEPHRINE (PF) 0.25% -1:200000 IJ SOLN
INTRAMUSCULAR | Status: DC | PRN
  Administered 2021-12-20: 20 mL

## 2021-12-20 MED ORDER — LACTATED RINGERS IV SOLN: INTRAVENOUS | Status: DC

## 2021-12-20 MED ORDER — BUPIVACAINE-EPINEPHRINE (PF) 0.25% -1:200000 IJ SOLN
INTRAMUSCULAR | Status: AC
  Filled 2021-12-20: qty 30

## 2021-12-20 MED ORDER — DEXAMETHASONE SODIUM PHOSPHATE 10 MG/ML IJ SOLN
INTRAMUSCULAR | Status: AC
  Filled 2021-12-20: qty 1

## 2021-12-20 MED ORDER — ONDANSETRON HCL 4 MG/2ML IJ SOLN
INTRAMUSCULAR | Status: AC
  Filled 2021-12-20: qty 2

## 2021-12-20 MED ORDER — MAGTRACE LYMPHATIC TRACER
INTRAMUSCULAR | Status: DC | PRN
  Administered 2021-12-20: 2 mL via INTRAMUSCULAR

## 2021-12-20 MED ORDER — FENTANYL CITRATE (PF) 100 MCG/2ML IJ SOLN
INTRAMUSCULAR | Status: DC | PRN
  Administered 2021-12-20 (×2): 25 ug via INTRAVENOUS
  Administered 2021-12-20: 50 ug via INTRAVENOUS

## 2021-12-20 MED ORDER — ACETAMINOPHEN 500 MG PO TABS
ORAL_TABLET | ORAL | Status: AC
  Filled 2021-12-20: qty 1

## 2021-12-20 MED ORDER — CEFAZOLIN SODIUM-DEXTROSE 2-4 GM/100ML-% IV SOLN
INTRAVENOUS | Status: AC
  Filled 2021-12-20: qty 100

## 2021-12-20 MED ORDER — LIDOCAINE 2% (20 MG/ML) 5 ML SYRINGE
INTRAMUSCULAR | Status: DC | PRN
  Administered 2021-12-20: 60 mg via INTRAVENOUS

## 2021-12-20 MED ORDER — PROPOFOL 10 MG/ML IV BOLUS
INTRAVENOUS | Status: DC | PRN
  Administered 2021-12-20: 130 mg via INTRAVENOUS

## 2021-12-20 MED ORDER — CELECOXIB 200 MG PO CAPS
200.0000 mg | ORAL_CAPSULE | Freq: Once | ORAL | Status: AC
  Administered 2021-12-20: 200 mg via ORAL

## 2021-12-20 MED ORDER — PROPOFOL 500 MG/50ML IV EMUL
INTRAVENOUS | Status: DC | PRN
  Administered 2021-12-20: 25 ug/kg/min via INTRAVENOUS

## 2021-12-20 MED ORDER — MIDAZOLAM HCL 5 MG/5ML IJ SOLN
INTRAMUSCULAR | Status: DC | PRN
  Administered 2021-12-20: 1 mg via INTRAVENOUS

## 2021-12-20 MED ORDER — MIDAZOLAM HCL 2 MG/2ML IJ SOLN
INTRAMUSCULAR | Status: AC
  Filled 2021-12-20: qty 2

## 2021-12-20 MED ORDER — PROPOFOL 500 MG/50ML IV EMUL
INTRAVENOUS | Status: AC
  Filled 2021-12-20: qty 50

## 2021-12-20 MED ORDER — DEXAMETHASONE SODIUM PHOSPHATE 4 MG/ML IJ SOLN
INTRAMUSCULAR | Status: DC | PRN
  Administered 2021-12-20: 4 mg via INTRAVENOUS

## 2021-12-20 MED ORDER — FENTANYL CITRATE (PF) 100 MCG/2ML IJ SOLN
INTRAMUSCULAR | Status: AC
  Filled 2021-12-20: qty 2

## 2021-12-20 MED ORDER — SODIUM CHLORIDE 0.9 % IV SOLN
INTRAVENOUS | Status: AC
  Filled 2021-12-20: qty 10

## 2021-12-20 MED ORDER — CELECOXIB 200 MG PO CAPS
ORAL_CAPSULE | ORAL | Status: AC
  Filled 2021-12-20: qty 1

## 2021-12-20 MED ORDER — SODIUM CHLORIDE 0.9 % IV SOLN
INTRAVENOUS | Status: DC | PRN
  Administered 2021-12-20: 500 mL

## 2021-12-20 MED ORDER — FENTANYL CITRATE (PF) 100 MCG/2ML IJ SOLN: 25.0000 ug | INTRAMUSCULAR | Status: DC | PRN

## 2021-12-20 MED ORDER — 0.9 % SODIUM CHLORIDE (POUR BTL) OPTIME
TOPICAL | Status: DC | PRN
  Administered 2021-12-20: 100 mL

## 2021-12-20 MED ORDER — OXYCODONE HCL 5 MG PO TABS: 5.0000 mg | ORAL_TABLET | Freq: Four times a day (QID) | ORAL | 0 refills | Status: DC | PRN

## 2021-12-20 MED ORDER — ACETAMINOPHEN 500 MG PO TABS
1000.0000 mg | ORAL_TABLET | Freq: Once | ORAL | Status: AC
  Administered 2021-12-20: 1000 mg via ORAL

## 2021-12-20 MED ORDER — EPHEDRINE SULFATE-NACL 50-0.9 MG/10ML-% IV SOSY
PREFILLED_SYRINGE | INTRAVENOUS | Status: DC | PRN
  Administered 2021-12-20 (×2): 10 mg via INTRAVENOUS

## 2021-12-20 MED ORDER — LIDOCAINE 2% (20 MG/ML) 5 ML SYRINGE
INTRAMUSCULAR | Status: AC
  Filled 2021-12-20: qty 5

## 2021-12-20 MED ORDER — ACETAMINOPHEN 500 MG PO TABS
ORAL_TABLET | ORAL | Status: AC
  Filled 2021-12-20: qty 2

## 2021-12-20 SURGICAL SUPPLY — 49 items
ADH SKN CLS APL DERMABOND .7 (GAUZE/BANDAGES/DRESSINGS) ×1
APL PRP STRL LF DISP 70% ISPRP (MISCELLANEOUS) ×1
APPLIER CLIP 9.375 MED OPEN (MISCELLANEOUS) ×1
APR CLP MED 9.3 20 MLT OPN (MISCELLANEOUS) ×1
BINDER BREAST LRG (GAUZE/BANDAGES/DRESSINGS) IMPLANT
BINDER BREAST MEDIUM (GAUZE/BANDAGES/DRESSINGS) IMPLANT
BINDER BREAST XLRG (GAUZE/BANDAGES/DRESSINGS) IMPLANT
BINDER BREAST XXLRG (GAUZE/BANDAGES/DRESSINGS) IMPLANT
BLADE SURG 15 STRL LF DISP TIS (BLADE) ×1 IMPLANT
BLADE SURG 15 STRL SS (BLADE) ×1
CANISTER SUC SOCK COL 7IN (MISCELLANEOUS) IMPLANT
CANISTER SUCT 1200ML W/VALVE (MISCELLANEOUS) IMPLANT
CHLORAPREP W/TINT 26 (MISCELLANEOUS) ×1 IMPLANT
CLIP APPLIE 9.375 MED OPEN (MISCELLANEOUS) IMPLANT
COVER BACK TABLE 60X90IN (DRAPES) ×1 IMPLANT
COVER MAYO STAND STRL (DRAPES) ×1 IMPLANT
COVER PROBE W GEL 5X96 (DRAPES) ×1 IMPLANT
DERMABOND ADVANCED .7 DNX12 (GAUZE/BANDAGES/DRESSINGS) ×1 IMPLANT
DRAPE LAPAROSCOPIC ABDOMINAL (DRAPES) IMPLANT
DRAPE LAPAROTOMY 100X72 PEDS (DRAPES) ×1 IMPLANT
DRAPE UTILITY XL STRL (DRAPES) ×1 IMPLANT
ELECT COATED BLADE 2.86 ST (ELECTRODE) ×1 IMPLANT
ELECT REM PT RETURN 9FT ADLT (ELECTROSURGICAL) ×1
ELECTRODE REM PT RTRN 9FT ADLT (ELECTROSURGICAL) ×1 IMPLANT
GLOVE BIOGEL PI IND STRL 8 (GLOVE) ×1 IMPLANT
GLOVE ECLIPSE 8.0 STRL XLNG CF (GLOVE) ×1 IMPLANT
GOWN STRL REUS W/ TWL LRG LVL3 (GOWN DISPOSABLE) ×2 IMPLANT
GOWN STRL REUS W/ TWL XL LVL3 (GOWN DISPOSABLE) ×1 IMPLANT
GOWN STRL REUS W/TWL LRG LVL3 (GOWN DISPOSABLE) ×2
GOWN STRL REUS W/TWL XL LVL3 (GOWN DISPOSABLE) ×1
HEMOSTAT ARISTA ABSORB 3G PWDR (HEMOSTASIS) IMPLANT
HEMOSTAT SNOW SURGICEL 2X4 (HEMOSTASIS) IMPLANT
KIT MARKER MARGIN INK (KITS) ×1 IMPLANT
NDL HYPO 25X1 1.5 SAFETY (NEEDLE) ×1 IMPLANT
NEEDLE HYPO 25X1 1.5 SAFETY (NEEDLE) ×1 IMPLANT
NS IRRIG 1000ML POUR BTL (IV SOLUTION) ×1 IMPLANT
PACK BASIN DAY SURGERY FS (CUSTOM PROCEDURE TRAY) ×1 IMPLANT
PENCIL SMOKE EVACUATOR (MISCELLANEOUS) ×1 IMPLANT
SLEEVE SCD COMPRESS KNEE MED (STOCKING) ×1 IMPLANT
SPIKE FLUID TRANSFER (MISCELLANEOUS) IMPLANT
SPONGE T-LAP 4X18 ~~LOC~~+RFID (SPONGE) ×1 IMPLANT
SUT MNCRL AB 4-0 PS2 18 (SUTURE) ×1 IMPLANT
SUT SILK 2 0 SH (SUTURE) IMPLANT
SUT VICRYL 3-0 CR8 SH (SUTURE) ×1 IMPLANT
SYR CONTROL 10ML LL (SYRINGE) ×1 IMPLANT
TOWEL GREEN STERILE FF (TOWEL DISPOSABLE) ×1 IMPLANT
TRAY FAXITRON CT DISP (TRAY / TRAY PROCEDURE) ×1 IMPLANT
TUBE CONNECTING 20X1/4 (TUBING) IMPLANT
YANKAUER SUCT BULB TIP NO VENT (SUCTIONS) IMPLANT

## 2021-12-20 NOTE — Op Note (Signed)
Preoperative diagnosis: Stage I right breast cancer upper outer quadrant ER positive  Postoperative diagnosis: Same  Procedure: Right breast seed localized lumpectomy  Surgeon: Erroll Luna, MD  Anesthesia: LMA with 0.25% Marcaine with epinephrine  EBL: Minimal  Specimen: Right breast mass with seed and clip verified by Faxitron  Drains: None  IV fluids: Per anesthesia record  Indications for procedure: The patient is a 71 year old female seen in the multidisciplinary breast clinic.  She is evaluated and opted for breast conserving surgery for stage I right breast cancer.  We also discussed the pros and cons of send lymph node mapping given her stage of disease, tumor characteristics and negative ultrasound of the right axilla.  We discussed omitting a sentinel node in the circumstance versus doing 1.  After discussion of the above imaging with all the physicians, she opted for mag tracer injection to decide that after lumpectomy is done.  Risk and benefits of surgery were discussed with her.  Complications of surgery the use of MAC trace and potential discoloration of breast were discussed.  Risk of bleeding, infection, cosmetic deformity, wound complication, poor wound healing, reexcision, and the need for a sentinel node were all discussed as well as complications of sentinel node mapping which include bleeding, infection, nerve injury, blood vessel injury, lymphedema, shoulder stiffness and pain, the need further treatments and/or procedures.  Description of procedure: The patient was met in the holding area and questions were answered.  Seed was placed as an outpatient into the right breast.  Films were available for review.  All questions were answered.  She was taken back to the operating room.  She was placed upon upon the OR table.  After induction of general anesthesia, right breast was prepped and draped in sterile fashion.  Timeout performed.  2 cc of mag tracer injection of the  right nipple and massaged for 5 minutes.  After this was done, neoprobe was used to identify the seed and clip in the right breast upper outer quadrant.  A curvilinear incision was made over the mass.  Dissection was carried down and all tissue and the seed and clip were excised with a grossly negative margin.  Posterior margin was pectoralis muscle and some the fascia was taken with the mass.  Anterior margin with skin.  Image revealed the seed and clip to be in the specimen.  This was oriented with ink and sent to pathology.  Cavities irrigated.  Made hemostatic with cautery.  Clips were placed.  The we then closed the cavity with deep layer 3-0 Vicryl.  4 Monocryl was used to close the skin in a subcuticular fashion.  Dermabond was applied.  All counts found to be correct.  Breast binder placed.  The patient was awoke extubated taken to recovery in satisfactory condition.

## 2021-12-20 NOTE — Anesthesia Procedure Notes (Addendum)
Procedure Name: LMA Insertion Date/Time: 12/20/2021 7:49 AM  Performed by: Maryella Shivers, CRNAPre-anesthesia Checklist: Patient identified, Emergency Drugs available, Suction available and Patient being monitored Patient Re-evaluated:Patient Re-evaluated prior to induction Oxygen Delivery Method: Circle system utilized Preoxygenation: Pre-oxygenation with 100% oxygen Induction Type: IV induction Ventilation: Mask ventilation without difficulty LMA: LMA inserted LMA Size: 4.0 Number of attempts: 1 Airway Equipment and Method: Bite block Placement Confirmation: positive ETCO2 Tube secured with: Tape Dental Injury: Teeth and Oropharynx as per pre-operative assessment

## 2021-12-20 NOTE — Discharge Instructions (Addendum)
[place] Office Phone Number [phone]  BREAST BIOPSY/ PARTIAL MASTECTOMY: POST OP INSTRUCTIONS  Always review your discharge instruction sheet given to you by the facility where your surgery was performed.  IF YOU HAVE DISABILITY OR FAMILY LEAVE FORMS, YOU MUST BRING THEM TO THE OFFICE FOR PROCESSING.  DO NOT GIVE THEM TO YOUR DOCTOR.  A prescription for pain medication may be given to you upon discharge.  Take your pain medication as prescribed, if needed.  If narcotic pain medicine is not needed, then you may take acetaminophen (Tylenol) or ibuprofen (Advil) as needed. Take your usually prescribed medications unless otherwise directed If you need a refill on your pain medication, please contact your pharmacy.  They will contact our office to request authorization.  Prescriptions will not be filled after 5pm or on week-ends. You should eat very light the first 24 hours after surgery, such as soup, crackers, pudding, etc.  Resume your normal diet the day after surgery. Most patients will experience some swelling and bruising in the breast.  Ice packs and a good support bra will help.  Swelling and bruising can take several days to resolve.  It is common to experience some constipation if taking pain medication after surgery.  Increasing fluid intake and taking a stool softener will usually help or prevent this problem from occurring.  A mild laxative (Milk of Magnesia or Miralax) should be taken according to package directions if there are no bowel movements after 48 hours. Unless discharge instructions indicate otherwise, you may remove your bandages 24-48 hours after surgery, and you may shower at that time.  You may have steri-strips (small skin tapes) in place directly over the incision.  These strips should be left on the skin for 7-10 days.  If your surgeon used skin glue on the incision, you may shower in 24 hours.  The glue will flake off over the next 2-3 weeks.  Any  sutures or staples will be removed at the office during your follow-up visit. ACTIVITIES:  You may resume regular daily activities (gradually increasing) beginning the next day.  Wearing a good support bra or sports bra minimizes pain and swelling.  You may have sexual intercourse when it is comfortable. You may drive when you no longer are taking prescription pain medication, you can comfortably wear a seatbelt, and you can safely maneuver your car and apply brakes. RETURN TO WORK:  ______________________________________________________________________________________ Dennis Bast should see your doctor in the office for a follow-up appointment approximately two weeks after your surgery.  Your doctor's nurse will typically make your follow-up appointment when she calls you with your pathology report.  Expect your pathology report 2-3 business days after your surgery.  You may call to check if you do not hear from [place] after three days. OTHER INSTRUCTIONS: _______________________________________________________________________________________________ _____________________________________________________________________________________________________________________________________ _____________________________________________________________________________________________________________________________________ _____________________________________________________________________________________________________________________________________  WHEN TO CALL YOUR DOCTOR: Fever over 101.0 Nausea and/or vomiting. Extreme swelling or bruising. Continued bleeding from incision. Increased pain, redness, or drainage from the incision.  The clinic staff is available to answer your questions during regular business hours.  Please don't hesitate to call and ask to speak to one of the nurses for clinical concerns.  If you have a medical emergency, go to the nearest emergency room or call 911.  A surgeon from [facility] is always on call at the hospital.  For further questions, please visit [email or web address]   Post Anesthesia Home Care Instructions  Activity: Get plenty of rest for the remainder of the  day. A responsible individual must stay with you for 24 hours following the procedure.  For the next 24 hours, DO NOT: -Drive a car -Paediatric nurse -Drink alcoholic beverages -Take any medication unless instructed by your physician -Make any legal decisions or sign important papers.  Meals: Start with liquid foods such as gelatin or soup. Progress to regular foods as tolerated. Avoid greasy, spicy, heavy foods. If nausea and/or vomiting occur, drink only clear liquids until the nausea and/or vomiting subsides. Call your physician if vomiting continues.  Special Instructions/Symptoms: Your throat may feel dry or sore from the anesthesia or the breathing tube placed in your throat during surgery. If this causes discomfort, gargle with warm salt water. The discomfort should disappear within 24 hours.  If you had a scopolamine patch placed behind your ear for the management of post- operative nausea and/or vomiting:  1. The medication in the patch is effective for 72 hours, after which it should be removed.  Wrap patch in a tissue and discard in the trash. Wash hands thoroughly with soap and water. 2. You may remove the patch earlier than 72 hours if you experience unpleasant side effects which may include dry mouth, dizziness or visual disturbances. 3. Avoid touching the patch. Wash your hands with soap and water after contact with the patch.       Next dose of Tylenol may be given at 1230

## 2021-12-20 NOTE — Interval H&P Note (Signed)
History and Physical Interval Note:  12/20/2021 7:22 AM  Megan Phillips  has presented today for surgery, with the diagnosis of RIGHT BREAST CANCER.  The various methods of treatment have been discussed with the patient and family. After consideration of risks, benefits and other options for treatment, the patient has consented to  Procedure(s): RIGHT BREAST LUMPECTOMY WITH RADIOACTIVE SEED LOCALIZATION (Right) as a surgical intervention.  The patient's history has been reviewed, patient examined, no change in status, stable for surgery.  I have reviewed the patient's chart and labs.  Questions were answered to the patient's satisfaction.     Turner Daniels MD

## 2021-12-20 NOTE — Transfer of Care (Signed)
Immediate Anesthesia Transfer of Care Note  Patient: Megan Phillips  Procedure(s) Performed: RIGHT BREAST LUMPECTOMY WITH RADIOACTIVE SEED LOCALIZATION (Right: Breast)  Patient Location: PACU  Anesthesia Type:General  Level of Consciousness: sedated  Airway & Oxygen Therapy: Patient Spontanous Breathing and Patient connected to face mask oxygen  Post-op Assessment: Report given to RN and Post -op Vital signs reviewed and stable  Post vital signs: Reviewed and stable  Last Vitals:  Vitals Value Taken Time  BP    Temp    Pulse    Resp    SpO2      Last Pain:  Vitals:   12/20/21 0629  TempSrc: Oral  PainSc: 0-No pain         Complications: No notable events documented.

## 2021-12-20 NOTE — Anesthesia Postprocedure Evaluation (Signed)
Anesthesia Post Note  Patient: Megan Phillips  Procedure(s) Performed: RIGHT BREAST LUMPECTOMY WITH RADIOACTIVE SEED LOCALIZATION (Right: Breast)     Patient location during evaluation: PACU Anesthesia Type: General Level of consciousness: awake and alert Pain management: pain level controlled Vital Signs Assessment: post-procedure vital signs reviewed and stable Respiratory status: spontaneous breathing, nonlabored ventilation, respiratory function stable and patient connected to nasal cannula oxygen Cardiovascular status: blood pressure returned to baseline and stable Postop Assessment: no apparent nausea or vomiting Anesthetic complications: no   No notable events documented.  Last Vitals:  Vitals:   12/20/21 0900 12/20/21 0923  BP: 119/75 135/81  Pulse:    Resp: 12 16  Temp:  36.6 C  SpO2: 98% 98%    Last Pain:  Vitals:   12/20/21 0923  TempSrc:   PainSc: 0-No pain                 March Rummage Osmel Dykstra

## 2021-12-20 NOTE — H&P (Signed)
Patient seen today in the [facility] for newly diagnosed right breast cancer. She is found to have a 1.9 cm mass by ultrasound right breast upper outer quadrant. The axilla was normal. No history of breast pain, breast mass or nipple discharge. Her only cancer history of uterine cancer in herself treated with hysterectomy alone in her mother. She has no other complaints except bruising at the biopsy site.  Review of Systems: A complete review of systems was obtained from the patient. I have reviewed this information and discussed as appropriate with the patient. See HPI as well for other ROS.    Medical History: Past Medical History:  Diagnosis Date  Anxiety  History of cancer   Patient Active Problem List  Diagnosis  Malignant neoplasm of upper-outer quadrant of right breast in female, estrogen receptor positive (CMS-HCC)  Endometrial adenocarcinoma (CMS-HCC)  Malignant neoplasm of uterus (CMS-HCC)   Past Surgical History:  Procedure Laterality Date  HYSTERECTOMY VAGINAL  Unknown date    No Known Allergies  Current Outpatient Medications on File Prior to Visit  Medication Sig Dispense Refill  albuterol (PROAIR RESPICLICK) 90 mcg/actuation inhaler Inhale 2 inhalations into the lungs every 6 (six) hours as needed for Wheezing  amoxicillin-clavulanate (AUGMENTIN) 875-125 mg tablet Take 1 tablet by mouth every 12 (twelve) hours  estradiol (VIVELLE-DOT) patch 0.05 mg/24 hr Place 1 patch onto the skin twice a week  methylphenidate HCl (RITALIN) 5 MG tablet Take 1 tablet by mouth 2 (two) times daily  pseudoephedrine (SUDAFED 12 HR) 120 mg 12 hr tablet Take 120 mg by mouth every 12 (twelve) hours   No current facility-administered medications on file prior to visit.   Family History  Problem Relation Age of Onset  Hyperlipidemia (Elevated cholesterol) Mother  High blood pressure (Hypertension) Mother  Stroke Mother    Social History   Tobacco Use  Smoking Status Never  Smokeless  Tobacco Never    Social History   Socioeconomic History  Marital status: Married  Tobacco Use  Smoking status: Never  Smokeless tobacco: Never  Vaping Use  Vaping Use: Never used  Substance and Sexual Activity  Alcohol use: Never  Drug use: Never  Sexual activity: Defer   Objective:  There were no vitals filed for this visit.  There is no height or weight on file to calculate BMI.  Physical Exam HENT:  Head: Normocephalic.  Cardiovascular:  Rate and Rhythm: Normal rate.  Pulmonary:  Effort: Pulmonary effort is normal.  Breath sounds: No stridor.  Chest:  Breasts: Right: Normal.  Left: Normal.  Musculoskeletal:  Cervical back: Normal range of motion.  Lymphadenopathy:  Upper Body:  Right upper body: No supraclavicular or axillary adenopathy.  Left upper body: No supraclavicular or axillary adenopathy.  Skin: General: Skin is warm.  Neurological:  Mental Status: She is alert.    Labs, Imaging and Diagnostic Testing: 1.9 cm mass right breast upper outer quadrant core biopsy proven to be invasive ductal carcinoma grade 2 ER positive PR positive HER2/neu negative with a KIA 10%  Assessment and Plan:   Diagnoses and all orders for this visit:  Malignant neoplasm of upper-outer quadrant of right breast in female, estrogen receptor positive (CMS-HCC)    Discussed breast conserving surgery versus mastectomy with reconstruction. Long-term survival, local regional recurrence and complications of each reviewed. Discussed the role of sentinel lymph node mapping and potentially omitting it given her age over 31, intermediate grade and size less than 2 cm. Discussed the pros and cons of potentially  finding disease is being around 8% in the lymph nodes. Also discussed the impact on survival would be minimal if any. Discussed lymphedema risk, shoulder pain, and morbidity from mapping. Offered mapping with lumpectomy as well given the above information and spent time discussing  this today with the patient. She would like to proceed with right breast seed lumpectomy. She would like to wait on mapping until final pathology is reviewed. I will inject mag trace at the time of surgery and then she can be mapped at a later time. Offered her immediate mapping versus node mapping as well today and discussed all the pros and cons of this. I discussed the potential impact on her treatment as well.The procedure has been discussed with the patient. Alternatives to surgery have been discussed with the patient. Risks of surgery include bleeding, Infection, Seroma formation, death, and the need for further surgery. The patient understands and wishes to proceed.   No follow-ups on file.  Kennieth Francois, MD

## 2021-12-21 ENCOUNTER — Encounter (HOSPITAL_BASED_OUTPATIENT_CLINIC_OR_DEPARTMENT_OTHER): Payer: Self-pay | Admitting: Surgery

## 2021-12-21 ENCOUNTER — Encounter: Payer: Self-pay | Admitting: Surgery

## 2021-12-21 LAB — SURGICAL PATHOLOGY

## 2021-12-21 NOTE — Progress Notes (Signed)
Left message stating courtesy call and if any questions or concerns please call the doctors office.  

## 2021-12-26 ENCOUNTER — Encounter: Payer: Self-pay | Admitting: *Deleted

## 2021-12-26 ENCOUNTER — Telehealth: Payer: Self-pay | Admitting: *Deleted

## 2021-12-26 NOTE — Telephone Encounter (Signed)
Received order for oncotype testing. Requisition faxed to pathology 

## 2022-01-02 DIAGNOSIS — Z170 Estrogen receptor positive status [ER+]: Secondary | ICD-10-CM | POA: Diagnosis not present

## 2022-01-02 DIAGNOSIS — C50411 Malignant neoplasm of upper-outer quadrant of right female breast: Secondary | ICD-10-CM | POA: Diagnosis not present

## 2022-01-03 ENCOUNTER — Ambulatory Visit: Payer: Medicare Other | Admitting: Hematology and Oncology

## 2022-01-09 ENCOUNTER — Encounter: Payer: Self-pay | Admitting: *Deleted

## 2022-01-09 ENCOUNTER — Encounter (HOSPITAL_COMMUNITY): Payer: Self-pay

## 2022-01-09 ENCOUNTER — Telehealth: Payer: Self-pay | Admitting: *Deleted

## 2022-01-09 NOTE — Telephone Encounter (Signed)
Received oncotype score of 10. Physician team notified. Called pt with results. Left detailed msg with results and next steps of xrt with Dr. Isidore Moos as well as appt date and time. Contact information provided for questions or needs.

## 2022-01-12 NOTE — Progress Notes (Signed)
Location of Breast Cancer:   Histology per Pathology Report:  FINAL MICROSCOPIC DIAGNOSIS: A. BREAST, RIGHT, LUMPECTOMY: -  Invasive ductal carcinoma (NOS)/invasive mammary carcinoma, NST. -  20 x 13 x 10 mm. -  Overall Nottingham histologic score II of III (tubular score 3/3; nuclear pleomorphism 3/3; mitotic index 1/3) -  Margins negative: closest margin anterior at 8 mm, all others greater than 10 mm pT1c pNX; see synoptic report for further information ONCOLOGY TABLE: INVASIVE CARCINOMA OF THE BREAST:  Resection Procedure: Lumpectomy Specimen Laterality: Right Histologic Type: Invasive ductal carcinoma (NOS)/invasive mammary carcinoma, NST Histologic Grade:      Glandular (Acinar)/Tubular Differentiation: 3/3      Nuclear Pleomorphism: 3/3      Mitotic Rate: 1/3      Overall Grade: II/III Tumor Size: 20 x 13 x 10 mm Ductal Carcinoma In Situ: Not identified Tumor Extent: Limited to breast parenchyma Treatment Effect in the Breast: No known presurgical therapy Margins: All margins negative for invasive carcinoma      Distance from Closest Margin (mm): 8 mm      Specify Closest Margin (required only if <25m): Anterior DCIS Margins: N/A      Distance from Closest Margin (mm): N/A      Specify Closest Margin (required only if <153m: N/A Regional Lymph Nodes: N/A/no lymph nodes submitted      Number of Lymph Nodes Examined: 0      Number of Sentinel Nodes Examined: 0      Number of Lymph Nodes with Macrometastases (>2 mm): N/A      Number of Lymph Nodes with Micrometastases: N/A      Number of Lymph Nodes with Isolated Tumor Cells (=0.2 mm or =200 cells): N/A      Size of Largest Metastatic Deposit (mm): N/A      Extranodal Extension: N/A Distant Metastasis:      Distant Site(s) Involved: N/A Breast Biomarker Testing Performed on Previous Biopsy:      Testing Performed on Case Number: [id number]            Estrogen Receptor: Positive, 100% strong             Progesterone Receptor: Positive, 100% strong            HER2: Negative, 1+            Ki-67: 10% Pathologic Stage Classification (pTNM, AJCC 8th Edition): pT1c, pNx Representative Tumor Block: A4 Comment(s): None   Receptor Status: ER(100% positive), PR (100% positive), Her2-neu (Negative, 1+ ), Ki-67(10%)  Did patient present with symptoms (if so, please note symptoms) or was this found on screening mammography?: screening mammogram  Past/Anticipated interventions by surgeon, if any: Dr. CoBrantley Stage10-17-23, Procedure: Right breast seed localized lumpectomy   Past/Anticipated interventions by medical oncology, if any:  Dr. IrChryl Heckn 11-30-21 SUMMARY OF ONCOLOGIC HISTORY:    Oncology History  Malignant neoplasm of upper-outer quadrant of right breast in female, estrogen receptor positive [place]    Screening mammogram showed possible mass in the right breast, indeterminate.  Diagnostic mammogram showed a 1.7 cm irregular mass in the right breast suspicious of malignancy.    11/23/2021 Pathology Results    Pathology from the right breast needle core biopsy showed invasive ductal carcinoma, grade 2, prognostic showed ER 100% positive strong staining PR 100% positive strong staining, Ki-67 of 10% and HER2 of 1+    11/28/2021 Initial Diagnosis    Malignant neoplasm of upper-outer quadrant of right  breast in female, estrogen receptor positive ([place])    11/30/2021 Cancer Staging    Staging form: Breast, AJCC 8th Edition - Clinical stage from 11/30/2021: Stage IA (cT1c, cN0, cM0, G2, ER+, PR+, HER2-) - Signed by Benay Pike, MD on 11/30/2021 Stage prefix: Initial diagnosis Histologic grading system: 3 grade system Laterality: Right Staged by: Pathologist and managing physician Stage used in treatment planning: Yes National guidelines used in treatment planning: Yes Type of national guideline used in treatment planning: NCCN  ASSESSMENT AND PLAN:  Malignant neoplasm of  upper-outer quadrant of right breast in female, estrogen receptor positive ([place]) This is a pleasant 71 year old postmenopausal female patient on long-term hormone replacement therapy for the past 30 years now diagnosed with invasive ductal carcinoma, grade 2, ER/PR strongly positive, Ki-67 of 10% and HER2 of 1+ referred to breast [place] for additional recommendations.  We have discussed the pathology in detail today.  Given strong ER/PR positivity and small tumor, she will proceed with upfront surgery.  We have discussed about optional Oncotype testing.  It is very less likely that she will need adjuvant chemotherapy but she appeared to be interested in pursuing Oncotype testing.   We have discussed about Oncotype Dx score which is a well validated prognostic scoring system which can predict outcome with endocrine therapy alone and whether chemotherapy reduces recurrence.  Typically in patients with ER positive cancers that are node negative if the RS score is high typically greater than or equal to 26, chemotherapy is recommended.  In women with intermediate recurrence score younger than 81, there can still be some role for chemotherapy in addition to endocrine therapy especially if the recurrence score is between 21-25. If chemotherapy is needed, this will precede radiation and then after radiation she will continue on antiestrogen therapy.    Following Oncotype testing, if she is not a candidate for adjuvant chemotherapy, she will proceed with adjuvant radiation followed by antiestrogen therapy.  We have discussed about both tamoxifen versus aromatase inhibitors.  At this time we are leaning towards aromatase inhibitors.  I discussed about mechanism of action of aromatase inhibitors, adverse effects including but not limited to fatigue, postmenopausal symptoms, arthralgias, vaginal dryness, bone loss etc. she may have a history of osteopenia/osteoporosis and took Fosamax for several years.  She has an  upcoming bone density scan scheduled for October 2023.  She will return to clinic after surgery to review Oncotype results and to discuss additional recommendations.  Lymphedema issues, if any:  none  Pain issues, if any:  none  SAFETY ISSUES: Prior radiation? no Pacemaker/ICD? no Possible current pregnancy?no Is the patient on methotrexate? no  Current Complaints / other details:  No concerns at this time.

## 2022-01-16 NOTE — Progress Notes (Signed)
Radiation Oncology         [phone]) [phone] ________________________________  Name: Megan Phillips MRN: [id number]  Date: 01/17/2022  DOB: 07-Oct-1950  Follow-Up Visit Note  Outpatient  CC: Charlane Ferretti, MD  Benay Pike, MD  Diagnosis:      ICD-10-CM   1. Malignant neoplasm of upper-outer quadrant of right breast in female, estrogen receptor positive ([place])  C50.411    Z17.0      Stage IA (pT1c cN0 cM0) Right Breast UOQ, Invasive Mammary Carcinoma, ER+ / PR+ / Her2-, Grade 2   CHIEF COMPLAINT: Here to discuss management of right breast cancer  Narrative:  The patient returns today for follow-up.     On her breast clinic consultation date of 11/30/21, she opted to pursue genetic testing. Results showed no clinically significant variants detected by BRCAplus or +RNAinsight testing. However, a variant of unknown significance was detected in the [facility] gene by +RNAinsight testing.   The patient opted to proceed with a right breast lumpectomy without nodal biopsies on 12/20/21 under the care of Dr. Brantley Stage. Pathology from the procedure revealed: tumor size of 2.0 x 1.3 x 1.0 cm ; histology of grade 2 invasive mammary carcinoma; margin status to invasive disease of 8 mm from the anterior margin; no lymph nodes were examined; ER status: 100% positive and PR status 100% positive, both with strong staining intensity; Proliferation marker Ki67 at 10%; Her2 status negative; Grade 2.  Oncotype DX was obtained on the final surgical sample and the recurrence score of 10 predicts a risk of recurrence outside the breast over the next 9 years of 3%, if the patient's only systemic therapy is an antiestrogen for 5 years.  It also predicts no significant benefit from chemotherapy.  The patient reports to be doing well after surgery. She states the surgery went really well, she did not have to use any opioids for pain control. She has gotten back to jogging with her dog in the mornings. She denies any  chest pain, shortness of breath, arm swelling, or difficulties with arm movement.         ALLERGIES:  has No Known Allergies.  Meds: Current Outpatient Medications  Medication Sig Dispense Refill   fexofenadine (ALLEGRA ALLERGY) 180 MG tablet Take 1 tablet every day by oral route.     methylphenidate (RITALIN) 5 MG tablet      sertraline (ZOLOFT) 25 MG tablet Take 50 mg by mouth daily.     oxyCODONE (OXY IR/ROXICODONE) 5 MG immediate release tablet Take 1 tablet (5 mg total) by mouth every 6 (six) hours as needed for severe pain. (Patient not taking: Reported on 01/17/2022) 15 tablet 0   No current facility-administered medications for this encounter.    Physical Findings:  height is _0  (1.6 m) and weight is 115 lb 2 oz (52.2 kg). Her oral temperature is 98.1 F (36.7 C). Her blood pressure is 116/65 and her pulse is 63. Her respiration is 18 and oxygen saturation is 100%. .     General: Alert and oriented, in no acute distress HEENT: Head is normocephalic. Extraocular movements are intact. Oropharynx is clear. Neck: Neck is supple, no palpable cervical or supraclavicular lymphadenopathy. Heart: Regular in rate and rhythm with no murmurs, rubs, or gallops. Chest: Clear to auscultation bilaterally, with no rhonchi, wheezes, or rales. Extremities: No cyanosis or edema. Lymphatics: see Neck Exam. No axillary lymphadenopathy.  Musculoskeletal: symmetric strength and muscle tone throughout. Neurologic: No obvious focalities. Speech is fluent.  Psychiatric: Judgment and insight are intact. Affect is appropriate. Breast exam reveals well healing surgical scar superior to the right nipple. Non tender to palpation.  No signs of induration, erythema, or infection present. No obvious masses or deformities palpated in either breast.   Lab Findings: Lab Results  Component Value Date   WBC 6.7 11/30/2021   HGB 13.1 11/30/2021   HCT 38.3 11/30/2021   MCV 90.1 11/30/2021   PLT 334 11/30/2021      Radiographic Findings: No results found.  Impression/Plan: This is a delightful patient with Stage IA (pT1c N0) Right Breast UOQ, Invasive Mammary Carcinoma, ER+ / PR+ / Her2-, Grade 2  We discussed adjuvant radiotherapy today.  I recommend 4 weeks of radiation therapy to the right breast and right axilla (treating with high tangents due to deferral of SLN bx) in order to decrease the risks of disease loco recurrence by 2/3rds.  We discussed the traditional radiation therapy option of 3 weeks of treatment followed by 1 week of a boost. Given that the patient will start treatment right after Thanksgiving, she would optimally like to have her finish treatment before Christmas. I discussed the option of 3 weeks of treatment followed by 3 days of boost at a slightly higher fraction in order to get her done with treatment before the holidays. This option will be just as effective in decreasing the risks of disease recurrence and I believe this patient will be able to tolerate this well. I reviewed the logistics, benefits, risks, and potential side effects of this treatment in detail. Risks may include but not necessary be limited to acute and late injury tissue in the radiation fields such as skin irritation (change in color/pigmentation, itching, dryness, pain, peeling). She may experience fatigue. We also discussed possible risk of long term cosmetic changes or scar tissue. There is also a smaller risk for lung toxicity, brachial plexopathy, lymphedema, musculoskeletal changes, rib fragility or induction of a second malignancy, and late chronic non-healing soft tissue wound.    The patient asked good questions which I answered to her satisfaction. She is enthusiastic about proceeding with treatment. A consent form has been signed and placed in her chart. Patient will begin simulation planning today.   The patient will receive 40.05 Gy in 15 fractions to the right breast and right axilla (treating with  high tangents due to deferral of SLN bx).  This will be followed by a boost of 7.5 Gy in 3 fractions.  On date of service, in total, I spent 40 minutes on this encounter. Patient was seen in person.  _____________________________________   Eppie Gibson, MD  This document serves as a record of services personally performed by Eppie Gibson, MD. It was created on her behalf by Roney Mans, a trained medical scribe. The creation of this record is based on the scribe's personal observations and the provider's statements to them. This document has been checked and approved by the attending provider.

## 2022-01-17 ENCOUNTER — Ambulatory Visit
Admission: RE | Admit: 2022-01-17 | Discharge: 2022-01-17 | Disposition: A | Discharge status: Home / Self Care | Payer: Medicare Other | Source: Ambulatory Visit | Attending: Radiation Oncology | Admitting: Radiation Oncology

## 2022-01-17 ENCOUNTER — Other Ambulatory Visit: Payer: Self-pay

## 2022-01-17 ENCOUNTER — Encounter: Payer: Self-pay | Admitting: Radiation Oncology

## 2022-01-17 VITALS — BP 116/65 | HR 63 | Temp 98.1°F | Resp 18 | Ht 63.0 in | Wt 115.1 lb

## 2022-01-17 DIAGNOSIS — Z510 Encounter for antineoplastic radiation therapy: Secondary | ICD-10-CM | POA: Insufficient documentation

## 2022-01-17 DIAGNOSIS — C50411 Malignant neoplasm of upper-outer quadrant of right female breast: Secondary | ICD-10-CM | POA: Insufficient documentation

## 2022-01-17 DIAGNOSIS — Z170 Estrogen receptor positive status [ER+]: Secondary | ICD-10-CM | POA: Insufficient documentation

## 2022-01-17 DIAGNOSIS — Z79899 Other long term (current) drug therapy: Secondary | ICD-10-CM | POA: Diagnosis not present

## 2022-01-18 ENCOUNTER — Inpatient Hospital Stay: Payer: Medicare Other | Attending: Hematology and Oncology | Admitting: Hematology and Oncology

## 2022-01-18 VITALS — BP 102/70 | HR 72 | Temp 98.7°F | Resp 16 | Ht 63.0 in | Wt 114.6 lb

## 2022-01-18 DIAGNOSIS — Z8542 Personal history of malignant neoplasm of other parts of uterus: Secondary | ICD-10-CM | POA: Insufficient documentation

## 2022-01-18 DIAGNOSIS — Z79899 Other long term (current) drug therapy: Secondary | ICD-10-CM | POA: Diagnosis not present

## 2022-01-18 DIAGNOSIS — Z170 Estrogen receptor positive status [ER+]: Secondary | ICD-10-CM | POA: Diagnosis not present

## 2022-01-18 DIAGNOSIS — Z90722 Acquired absence of ovaries, bilateral: Secondary | ICD-10-CM | POA: Diagnosis not present

## 2022-01-18 DIAGNOSIS — C50411 Malignant neoplasm of upper-outer quadrant of right female breast: Secondary | ICD-10-CM | POA: Diagnosis not present

## 2022-01-18 MED ORDER — TAMOXIFEN CITRATE 20 MG PO TABS: 20.0000 mg | ORAL_TABLET | Freq: Every day | ORAL | 3 refills | Status: AC

## 2022-01-18 NOTE — Assessment & Plan Note (Addendum)
This is a pleasant 71 year old postmenopausal female patient on long-term hormone replacement therapy for the past 30 years now diagnosed with invasive ductal carcinoma, grade 2, ER/PR strongly positive, Ki-67 of 10% and HER2 of 1+ referred to breast [place] for additional recommendations.  We have discussed the pathology in detail today.  Given strong ER/PR positivity and small tumor, she will proceed with upfront surgery.  We have discussed about optional Oncotype testing.  It is very less likely that she will need adjuvant chemotherapy but she appeared to be interested in pursuing Oncotype testing. Oncotype score resulted at 10, no benefit from adjuvant chemotherapy.  She will proceed with adjuvant radiation followed by antiestrogen therapy.  Given her prior history of long-term estrogen use, we have discussed about starting with tamoxifen for better tolerance versus aromatase inhibitors.  She is agreeable to trying tamoxifen.  She understands the risk of DVT/PE, endometrial hyperplasia and endometrial cancer.  She had hysterectomy hence she is not as concerned about this.  She has no personal history or family history of DVT/PE and tries to stay active.  She understands the benefit of bone density from tamoxifen.  Tamoxifen prescription has been dispensed to the pharmacy of her choice.  After completion of radiation, she will wait about 2 weeks and start tamoxifen and return to clinic with [place] in April for toxicity check.

## 2022-01-18 NOTE — Progress Notes (Signed)
[facility] NOTE  Patient Care Team: Charlane Ferretti, MD as PCP - General (Internal Medicine) Dian Queen, MD as PCP - OBGYN (Obstetrics and Gynecology) Erroll Luna, MD as Consulting Physician (General Surgery) Benay Pike, MD as Consulting Physician (Hematology and Oncology) Eppie Gibson, MD as Attending Physician (Radiation Oncology) Mauro Kaufmann, RN as Oncology Nurse Navigator Rockwell Germany, RN as Oncology Nurse Navigator  CHIEF COMPLAINTS/PURPOSE OF CONSULTATION:  IDC follow up  HISTORY OF PRESENTING ILLNESS:  Megan Phillips 71 y.o. female is here because of recent diagnosis of right breast IDC  I reviewed her records extensively and collaborated the history with the patient.  SUMMARY OF ONCOLOGIC HISTORY: Oncology History  Malignant neoplasm of upper-outer quadrant of right breast in female, estrogen receptor positive ([place])  11/14/2021 Mammogram   Screening mammogram showed possible mass in the right breast, indeterminate.  Diagnostic mammogram showed a 1.7 cm irregular mass in the right breast suspicious of malignancy.   11/23/2021 Pathology Results   Pathology from the right breast needle core biopsy showed invasive ductal carcinoma, grade 2, prognostic showed ER 100% positive strong staining PR 100% positive strong staining, Ki-67 of 10% and HER2 of 1+   11/28/2021 Initial Diagnosis   Malignant neoplasm of upper-outer quadrant of right breast in female, estrogen receptor positive ([place])   11/30/2021 Cancer Staging   Staging form: Breast, AJCC 8th Edition - Clinical stage from 11/30/2021: Stage IA (cT1c, cN0, cM0, G2, ER+, PR+, HER2-) - Signed by Benay Pike, MD on 11/30/2021 Stage prefix: Initial diagnosis Histologic grading system: 3 grade system Laterality: Right Staged by: Pathologist and managing physician Stage used in treatment planning: Yes National guidelines used in treatment planning: Yes Type of national guideline  used in treatment planning: NCCN   12/13/2021 Genetic Testing   Negative genetic testing: no pathogenic variants detected in [facility] CancerNextExpanded +RNAinsight Panel.  VUS in MSH3 at  p.V682L (c.2044G>C).  Report date is 12/13/2021.   The CancerNext-Expanded gene panel offered by [facility] and includes sequencing, rearrangement, and RNA analysis for the following 77 genes: AIP, ALK, APC, ATM, AXIN2, BAP1, BARD1, BLM, BMPR1A, BRCA1, BRCA2, BRIP1, CDC73, CDH1, CDK4, CDKN1B, CDKN2A, CHEK2, CTNNA1, DICER1, FANCC, FH, FLCN, GALNT12, KIF1B, LZTR1, MAX, MEN1, MET, MLH1, MSH2, MSH3, MSH6, MUTYH, NBN, NF1, NF2, NTHL1, PALB2, PHOX2B, PMS2, POT1, PRKAR1A, PTCH1, PTEN, RAD51C, RAD51D, RB1, RECQL, RET, SDHA, SDHAF2, SDHB, SDHC, SDHD, SMAD4, SMARCA4, SMARCB1, SMARCE1, STK11, SUFU, TMEM127, TP53, TSC1, TSC2, VHL and XRCC2 (sequencing and deletion/duplication); EGFR, EGLN1, HOXB13, KIT, MITF, PDGFRA, POLD1, and POLE (sequencing only); EPCAM and GREM1 (deletion/duplication only).    12/20/2021 Cancer Staging   Staging form: Breast, AJCC 8th Edition - Pathologic stage from 12/20/2021: Stage Unknown (pT1c, pNX, cM0, G2, ER+, PR+, HER2-, Oncotype DX score: 10) - Signed by Marlynn Perking, PA-C on 01/17/2022 Stage prefix: Initial diagnosis Multigene prognostic tests performed: Oncotype DX Recurrence score range: Less than 11 Histologic grading system: 3 grade system    Ms Selner is here for a follow up Since last visit, she had lumpectomy, healing really well. She is starting adj radiation week after Thanksgiving. She will complete radiation late December and start anti estrogen therapy after this.  Rest of the pertinent 10 point ROS reviewed and negative  MEDICAL HISTORY:  Past Medical History:  Diagnosis Date   ADHD (attention deficit hyperactivity disorder)    Anxiety    Breast cancer ([place]) 11/2021   right breast IDC   Complication of anesthesia  Depression    Endometrioid adenocarcinoma  06/2009   Stage 1A grade 1   Family history of breast cancer 11/30/2021   History of endometrial cancer 11/30/2021   PONV (postoperative nausea and vomiting)     SURGICAL HISTORY: Past Surgical History:  Procedure Laterality Date   ABDOMINAL HYSTERECTOMY  06/04/2009   Robotic Hyst, BSO, pelvic lymph node dissection   BREAST LUMPECTOMY WITH RADIOACTIVE SEED LOCALIZATION Right 12/20/2021   Procedure: RIGHT BREAST LUMPECTOMY WITH RADIOACTIVE SEED LOCALIZATION;  Surgeon: Erroll Luna, MD;  Location: [facility];  Service: General;  Laterality: Right;   COLONOSCOPY     DILATION AND CURETTAGE OF UTERUS      SOCIAL HISTORY: Social History   Socioeconomic History   Marital status: Married    Spouse name: Not on file   Number of children: Not on file   Years of education: Not on file   Highest education level: Not on file  Occupational History   Not on file  Tobacco Use   Smoking status: Never   Smokeless tobacco: Not on file  Substance and Sexual Activity   Alcohol use: Yes    Comment: Few glasses of wine a week   Drug use: Never   Sexual activity: Yes    Birth control/protection: Surgical    Comment: hyst  Other Topics Concern   Not on file  Social History Narrative   Not on file   Social Determinants of Health   Financial Resource Strain: Low Risk  (11/30/2021)   Overall Financial Resource Strain (CARDIA)    Difficulty of Paying Living Expenses: Not hard at all  Food Insecurity: No Food Insecurity (11/30/2021)   Hunger Vital Sign    Worried About Running Out of Food in the Last Year: Never true    Ran Out of Food in the Last Year: Never true  Transportation Needs: No Transportation Needs (11/30/2021)   PRAPARE - Hydrologist (Medical): No    Lack of Transportation (Non-Medical): No  Physical Activity: Not on file  Stress: Not on file  Social Connections: Not on file  Intimate Partner Violence: Not on file    FAMILY  HISTORY: Family History  Problem Relation Age of Onset   Hyperlipidemia Mother    Heart disease Mother    Stroke Mother    Uterine cancer Mother 62       ? or other reason for hysterectomy   Heart disease Father    Heart disease Brother    Breast cancer Maternal Aunt        dx 88s   Breast cancer Other        mat aunt's granddaughter; dx 83s    ALLERGIES:  has No Known Allergies.  MEDICATIONS:  Current Outpatient Medications  Medication Sig Dispense Refill   tamoxifen (NOLVADEX) 20 MG tablet Take 1 tablet (20 mg total) by mouth daily. 90 tablet 3   fexofenadine (ALLEGRA ALLERGY) 180 MG tablet Take 1 tablet every day by oral route.     methylphenidate (RITALIN) 5 MG tablet      oxyCODONE (OXY IR/ROXICODONE) 5 MG immediate release tablet Take 1 tablet (5 mg total) by mouth every 6 (six) hours as needed for severe pain. (Patient not taking: Reported on 01/17/2022) 15 tablet 0   sertraline (ZOLOFT) 25 MG tablet Take 50 mg by mouth daily.     No current facility-administered medications for this visit.    REVIEW OF SYSTEMS:   Constitutional: Denies  fevers, chills or abnormal night sweats Eyes: Denies blurriness of vision, double vision or watery eyes Ears, nose, mouth, throat, and face: Denies mucositis or sore throat Respiratory: Denies cough, dyspnea or wheezes Cardiovascular: Denies palpitation, chest discomfort or lower extremity swelling Gastrointestinal:  Denies nausea, heartburn or change in bowel habits Skin: Denies abnormal skin rashes Lymphatics: Denies new lymphadenopathy or easy bruising Neurological:Denies numbness, tingling or new weaknesses Behavioral/Psych: Mood is stable, no new changes  Breast: Denies any palpable lumps or discharge All other systems were reviewed with the patient and are negative.  PHYSICAL EXAMINATION: ECOG PERFORMANCE STATUS: 0 - Asymptomatic  Vitals:   01/18/22 1041  BP: 102/70  Pulse: 72  Resp: 16  Temp: 98.7 F (37.1 C)  SpO2:  100%   Filed Weights   01/18/22 1041  Weight: 114 lb 9.6 oz (52 kg)    Physical exam deferred today in lieu of counseling.  She just had a postop visit LABORATORY DATA:  I have reviewed the data as listed Lab Results  Component Value Date   WBC 6.7 11/30/2021   HGB 13.1 11/30/2021   HCT 38.3 11/30/2021   MCV 90.1 11/30/2021   PLT 334 11/30/2021   Lab Results  Component Value Date   NA 137 11/30/2021   K 4.1 11/30/2021   CL 106 11/30/2021   CO2 27 11/30/2021    RADIOGRAPHIC STUDIES: I have personally reviewed the radiological reports and agreed with the findings in the report.  ASSESSMENT AND PLAN:  Malignant neoplasm of upper-outer quadrant of right breast in female, estrogen receptor positive ([place]) This is a pleasant 71 year old postmenopausal female patient on long-term hormone replacement therapy for the past 30 years now diagnosed with invasive ductal carcinoma, grade 2, ER/PR strongly positive, Ki-67 of 10% and HER2 of 1+ referred to breast [place] for additional recommendations.  We have discussed the pathology in detail today.  Given strong ER/PR positivity and small tumor, she will proceed with upfront surgery.  We have discussed about optional Oncotype testing.  It is very less likely that she will need adjuvant chemotherapy but she appeared to be interested in pursuing Oncotype testing. Oncotype score resulted at 10, no benefit from adjuvant chemotherapy.  She will proceed with adjuvant radiation followed by antiestrogen therapy.  Given her prior history of long-term estrogen use, we have discussed about starting with tamoxifen for better tolerance versus aromatase inhibitors.  She is agreeable to trying tamoxifen.  She understands the risk of DVT/PE, endometrial hyperplasia and endometrial cancer.  She had hysterectomy hence she is not as concerned about this.  She has no personal history or family history of DVT/PE and tries to stay active.  She understands the benefit of bone  density from tamoxifen.  Tamoxifen prescription has been dispensed to the pharmacy of her choice.  After completion of radiation, she will wait about 2 weeks and start tamoxifen and return to clinic with [place] in April for toxicity check.  Total time spent: 30 minutes including history, physical exam, review of records, counseling and coordination of care All questions were answered. The patient knows to call the clinic with any problems, questions or concerns.    Benay Pike, MD 01/18/22

## 2022-01-20 DIAGNOSIS — C50411 Malignant neoplasm of upper-outer quadrant of right female breast: Secondary | ICD-10-CM | POA: Diagnosis not present

## 2022-01-20 DIAGNOSIS — Z510 Encounter for antineoplastic radiation therapy: Secondary | ICD-10-CM | POA: Diagnosis not present

## 2022-01-20 DIAGNOSIS — Z170 Estrogen receptor positive status [ER+]: Secondary | ICD-10-CM | POA: Diagnosis not present

## 2022-01-23 ENCOUNTER — Encounter: Payer: Self-pay | Admitting: *Deleted

## 2022-01-30 ENCOUNTER — Other Ambulatory Visit: Payer: Self-pay

## 2022-01-30 ENCOUNTER — Ambulatory Visit
Admission: RE | Admit: 2022-01-30 | Discharge: 2022-01-30 | Disposition: A | Discharge status: Home / Self Care | Payer: Medicare Other | Source: Ambulatory Visit | Attending: Radiation Oncology | Admitting: Radiation Oncology

## 2022-01-30 DIAGNOSIS — Z170 Estrogen receptor positive status [ER+]: Secondary | ICD-10-CM

## 2022-01-30 DIAGNOSIS — C50411 Malignant neoplasm of upper-outer quadrant of right female breast: Secondary | ICD-10-CM | POA: Diagnosis not present

## 2022-01-30 DIAGNOSIS — Z510 Encounter for antineoplastic radiation therapy: Secondary | ICD-10-CM | POA: Diagnosis not present

## 2022-01-30 LAB — RAD ONC ARIA SESSION SUMMARY
Course Elapsed Days: 0
Plan Fractions Treated to Date: 1
Plan Prescribed Dose Per Fraction: 2.67 Gy
Plan Total Fractions Prescribed: 15
Plan Total Prescribed Dose: 40.05 Gy
Reference Point Dosage Given to Date: 2.67 Gy
Reference Point Session Dosage Given: 2.67 Gy
Session Number: 1

## 2022-01-30 MED ORDER — RADIAPLEXRX EX GEL: Freq: Once | CUTANEOUS | Status: AC

## 2022-01-30 MED ORDER — ALRA NON-METALLIC DEODORANT (RAD-ONC)
1.0000 | Freq: Once | TOPICAL | Status: AC
  Administered 2022-01-30: 1 via TOPICAL

## 2022-01-31 ENCOUNTER — Ambulatory Visit
Admission: RE | Admit: 2022-01-31 | Discharge: 2022-01-31 | Disposition: A | Discharge status: Home / Self Care | Payer: Medicare Other | Source: Ambulatory Visit | Attending: Radiation Oncology | Admitting: Radiation Oncology

## 2022-01-31 ENCOUNTER — Other Ambulatory Visit: Payer: Self-pay

## 2022-01-31 DIAGNOSIS — C50411 Malignant neoplasm of upper-outer quadrant of right female breast: Secondary | ICD-10-CM | POA: Diagnosis not present

## 2022-01-31 DIAGNOSIS — Z510 Encounter for antineoplastic radiation therapy: Secondary | ICD-10-CM | POA: Diagnosis not present

## 2022-01-31 DIAGNOSIS — Z170 Estrogen receptor positive status [ER+]: Secondary | ICD-10-CM | POA: Diagnosis not present

## 2022-01-31 LAB — RAD ONC ARIA SESSION SUMMARY
Course Elapsed Days: 1
Plan Fractions Treated to Date: 2
Plan Prescribed Dose Per Fraction: 2.67 Gy
Plan Total Fractions Prescribed: 15
Plan Total Prescribed Dose: 40.05 Gy
Reference Point Dosage Given to Date: 5.34 Gy
Reference Point Session Dosage Given: 2.67 Gy
Session Number: 2

## 2022-02-01 ENCOUNTER — Ambulatory Visit
Admission: RE | Admit: 2022-02-01 | Discharge: 2022-02-01 | Disposition: A | Discharge status: Home / Self Care | Payer: Medicare Other | Source: Ambulatory Visit | Attending: Radiation Oncology | Admitting: Radiation Oncology

## 2022-02-01 ENCOUNTER — Other Ambulatory Visit: Payer: Self-pay

## 2022-02-01 DIAGNOSIS — C50411 Malignant neoplasm of upper-outer quadrant of right female breast: Secondary | ICD-10-CM | POA: Diagnosis not present

## 2022-02-01 DIAGNOSIS — Z170 Estrogen receptor positive status [ER+]: Secondary | ICD-10-CM | POA: Diagnosis not present

## 2022-02-01 DIAGNOSIS — Z510 Encounter for antineoplastic radiation therapy: Secondary | ICD-10-CM | POA: Diagnosis not present

## 2022-02-01 LAB — RAD ONC ARIA SESSION SUMMARY
Course Elapsed Days: 2
Plan Fractions Treated to Date: 3
Plan Prescribed Dose Per Fraction: 2.67 Gy
Plan Total Fractions Prescribed: 15
Plan Total Prescribed Dose: 40.05 Gy
Reference Point Dosage Given to Date: 8.01 Gy
Reference Point Session Dosage Given: 2.67 Gy
Session Number: 3

## 2022-02-02 ENCOUNTER — Ambulatory Visit
Admission: RE | Admit: 2022-02-02 | Discharge: 2022-02-02 | Disposition: A | Discharge status: Home / Self Care | Payer: Medicare Other | Source: Ambulatory Visit | Attending: Radiation Oncology | Admitting: Radiation Oncology

## 2022-02-02 ENCOUNTER — Other Ambulatory Visit: Payer: Self-pay

## 2022-02-02 DIAGNOSIS — Z510 Encounter for antineoplastic radiation therapy: Secondary | ICD-10-CM | POA: Diagnosis not present

## 2022-02-02 DIAGNOSIS — Z170 Estrogen receptor positive status [ER+]: Secondary | ICD-10-CM | POA: Diagnosis not present

## 2022-02-02 DIAGNOSIS — C50411 Malignant neoplasm of upper-outer quadrant of right female breast: Secondary | ICD-10-CM | POA: Diagnosis not present

## 2022-02-02 LAB — RAD ONC ARIA SESSION SUMMARY
Course Elapsed Days: 3
Plan Fractions Treated to Date: 4
Plan Prescribed Dose Per Fraction: 2.67 Gy
Plan Total Fractions Prescribed: 15
Plan Total Prescribed Dose: 40.05 Gy
Reference Point Dosage Given to Date: 10.68 Gy
Reference Point Session Dosage Given: 2.67 Gy
Session Number: 4

## 2022-02-03 ENCOUNTER — Ambulatory Visit
Admission: RE | Admit: 2022-02-03 | Discharge: 2022-02-03 | Disposition: A | Discharge status: Home / Self Care | Payer: Medicare Other | Source: Ambulatory Visit | Attending: Radiation Oncology | Admitting: Radiation Oncology

## 2022-02-03 ENCOUNTER — Other Ambulatory Visit: Payer: Self-pay

## 2022-02-03 DIAGNOSIS — Z170 Estrogen receptor positive status [ER+]: Secondary | ICD-10-CM | POA: Diagnosis not present

## 2022-02-03 DIAGNOSIS — C50411 Malignant neoplasm of upper-outer quadrant of right female breast: Secondary | ICD-10-CM | POA: Insufficient documentation

## 2022-02-03 DIAGNOSIS — Z510 Encounter for antineoplastic radiation therapy: Secondary | ICD-10-CM | POA: Diagnosis not present

## 2022-02-03 LAB — RAD ONC ARIA SESSION SUMMARY
Course Elapsed Days: 4
Plan Fractions Treated to Date: 5
Plan Prescribed Dose Per Fraction: 2.67 Gy
Plan Total Fractions Prescribed: 15
Plan Total Prescribed Dose: 40.05 Gy
Reference Point Dosage Given to Date: 13.35 Gy
Reference Point Session Dosage Given: 2.67 Gy
Session Number: 5

## 2022-02-06 ENCOUNTER — Ambulatory Visit: Payer: Medicare Other

## 2022-02-06 ENCOUNTER — Other Ambulatory Visit: Payer: Self-pay

## 2022-02-06 ENCOUNTER — Ambulatory Visit
Admission: RE | Admit: 2022-02-06 | Discharge: 2022-02-06 | Disposition: A | Discharge status: Home / Self Care | Payer: Medicare Other | Source: Ambulatory Visit | Attending: Radiation Oncology | Admitting: Radiation Oncology

## 2022-02-06 DIAGNOSIS — C50411 Malignant neoplasm of upper-outer quadrant of right female breast: Secondary | ICD-10-CM | POA: Diagnosis not present

## 2022-02-06 DIAGNOSIS — Z170 Estrogen receptor positive status [ER+]: Secondary | ICD-10-CM | POA: Diagnosis not present

## 2022-02-06 DIAGNOSIS — Z510 Encounter for antineoplastic radiation therapy: Secondary | ICD-10-CM | POA: Diagnosis not present

## 2022-02-06 LAB — RAD ONC ARIA SESSION SUMMARY
Course Elapsed Days: 7
Plan Fractions Treated to Date: 6
Plan Prescribed Dose Per Fraction: 2.67 Gy
Plan Total Fractions Prescribed: 15
Plan Total Prescribed Dose: 40.05 Gy
Reference Point Dosage Given to Date: 16.02 Gy
Reference Point Session Dosage Given: 2.67 Gy
Session Number: 6

## 2022-02-07 ENCOUNTER — Other Ambulatory Visit: Payer: Self-pay

## 2022-02-07 ENCOUNTER — Ambulatory Visit
Admission: RE | Admit: 2022-02-07 | Discharge: 2022-02-07 | Disposition: A | Discharge status: Home / Self Care | Payer: Medicare Other | Source: Ambulatory Visit | Attending: Radiation Oncology | Admitting: Radiation Oncology

## 2022-02-07 DIAGNOSIS — Z510 Encounter for antineoplastic radiation therapy: Secondary | ICD-10-CM | POA: Diagnosis not present

## 2022-02-07 DIAGNOSIS — C50411 Malignant neoplasm of upper-outer quadrant of right female breast: Secondary | ICD-10-CM | POA: Diagnosis not present

## 2022-02-07 DIAGNOSIS — Z170 Estrogen receptor positive status [ER+]: Secondary | ICD-10-CM | POA: Diagnosis not present

## 2022-02-07 LAB — RAD ONC ARIA SESSION SUMMARY
Course Elapsed Days: 8
Plan Fractions Treated to Date: 7
Plan Prescribed Dose Per Fraction: 2.67 Gy
Plan Total Fractions Prescribed: 15
Plan Total Prescribed Dose: 40.05 Gy
Reference Point Dosage Given to Date: 18.69 Gy
Reference Point Session Dosage Given: 2.67 Gy
Session Number: 7

## 2022-02-08 ENCOUNTER — Ambulatory Visit
Admission: RE | Admit: 2022-02-08 | Discharge: 2022-02-08 | Disposition: A | Discharge status: Home / Self Care | Payer: Medicare Other | Source: Ambulatory Visit | Attending: Radiation Oncology | Admitting: Radiation Oncology

## 2022-02-08 ENCOUNTER — Other Ambulatory Visit: Payer: Self-pay

## 2022-02-08 DIAGNOSIS — C50411 Malignant neoplasm of upper-outer quadrant of right female breast: Secondary | ICD-10-CM | POA: Diagnosis not present

## 2022-02-08 DIAGNOSIS — Z510 Encounter for antineoplastic radiation therapy: Secondary | ICD-10-CM | POA: Diagnosis not present

## 2022-02-08 DIAGNOSIS — Z170 Estrogen receptor positive status [ER+]: Secondary | ICD-10-CM | POA: Diagnosis not present

## 2022-02-08 LAB — RAD ONC ARIA SESSION SUMMARY
Course Elapsed Days: 9
Plan Fractions Treated to Date: 8
Plan Prescribed Dose Per Fraction: 2.67 Gy
Plan Total Fractions Prescribed: 15
Plan Total Prescribed Dose: 40.05 Gy
Reference Point Dosage Given to Date: 21.36 Gy
Reference Point Session Dosage Given: 2.67 Gy
Session Number: 8

## 2022-02-09 ENCOUNTER — Other Ambulatory Visit: Payer: Self-pay

## 2022-02-09 ENCOUNTER — Ambulatory Visit
Admission: RE | Admit: 2022-02-09 | Discharge: 2022-02-09 | Disposition: A | Discharge status: Home / Self Care | Payer: Medicare Other | Source: Ambulatory Visit | Attending: Radiation Oncology | Admitting: Radiation Oncology

## 2022-02-09 DIAGNOSIS — C50411 Malignant neoplasm of upper-outer quadrant of right female breast: Secondary | ICD-10-CM | POA: Diagnosis not present

## 2022-02-09 DIAGNOSIS — Z510 Encounter for antineoplastic radiation therapy: Secondary | ICD-10-CM | POA: Diagnosis not present

## 2022-02-09 DIAGNOSIS — Z170 Estrogen receptor positive status [ER+]: Secondary | ICD-10-CM | POA: Diagnosis not present

## 2022-02-09 LAB — RAD ONC ARIA SESSION SUMMARY
Course Elapsed Days: 10
Plan Fractions Treated to Date: 9
Plan Prescribed Dose Per Fraction: 2.67 Gy
Plan Total Fractions Prescribed: 15
Plan Total Prescribed Dose: 40.05 Gy
Reference Point Dosage Given to Date: 24.03 Gy
Reference Point Session Dosage Given: 2.67 Gy
Session Number: 9

## 2022-02-10 ENCOUNTER — Other Ambulatory Visit: Payer: Self-pay

## 2022-02-10 ENCOUNTER — Ambulatory Visit
Admission: RE | Admit: 2022-02-10 | Discharge: 2022-02-10 | Disposition: A | Discharge status: Home / Self Care | Payer: Medicare Other | Source: Ambulatory Visit | Attending: Radiation Oncology | Admitting: Radiation Oncology

## 2022-02-10 DIAGNOSIS — C50411 Malignant neoplasm of upper-outer quadrant of right female breast: Secondary | ICD-10-CM | POA: Diagnosis not present

## 2022-02-10 DIAGNOSIS — Z510 Encounter for antineoplastic radiation therapy: Secondary | ICD-10-CM | POA: Diagnosis not present

## 2022-02-10 DIAGNOSIS — Z170 Estrogen receptor positive status [ER+]: Secondary | ICD-10-CM | POA: Diagnosis not present

## 2022-02-10 LAB — RAD ONC ARIA SESSION SUMMARY
Course Elapsed Days: 11
Plan Fractions Treated to Date: 10
Plan Prescribed Dose Per Fraction: 2.67 Gy
Plan Total Fractions Prescribed: 15
Plan Total Prescribed Dose: 40.05 Gy
Reference Point Dosage Given to Date: 26.7 Gy
Reference Point Session Dosage Given: 2.67 Gy
Session Number: 10

## 2022-02-13 ENCOUNTER — Ambulatory Visit
Admission: RE | Admit: 2022-02-13 | Discharge: 2022-02-13 | Disposition: A | Discharge status: Home / Self Care | Payer: Medicare Other | Source: Ambulatory Visit | Attending: Radiation Oncology | Admitting: Radiation Oncology

## 2022-02-13 ENCOUNTER — Other Ambulatory Visit: Payer: Self-pay

## 2022-02-13 DIAGNOSIS — Z170 Estrogen receptor positive status [ER+]: Secondary | ICD-10-CM

## 2022-02-13 DIAGNOSIS — Z510 Encounter for antineoplastic radiation therapy: Secondary | ICD-10-CM | POA: Diagnosis not present

## 2022-02-13 DIAGNOSIS — C50411 Malignant neoplasm of upper-outer quadrant of right female breast: Secondary | ICD-10-CM | POA: Diagnosis not present

## 2022-02-13 LAB — RAD ONC ARIA SESSION SUMMARY
Course Elapsed Days: 14
Plan Fractions Treated to Date: 11
Plan Prescribed Dose Per Fraction: 2.67 Gy
Plan Total Fractions Prescribed: 15
Plan Total Prescribed Dose: 40.05 Gy
Reference Point Dosage Given to Date: 29.37 Gy
Reference Point Session Dosage Given: 2.67 Gy
Session Number: 11

## 2022-02-13 MED ORDER — RADIAPLEXRX EX GEL: Freq: Once | CUTANEOUS | Status: AC

## 2022-02-14 ENCOUNTER — Other Ambulatory Visit: Payer: Self-pay

## 2022-02-14 ENCOUNTER — Ambulatory Visit
Admission: RE | Admit: 2022-02-14 | Discharge: 2022-02-14 | Disposition: A | Discharge status: Home / Self Care | Payer: Medicare Other | Source: Ambulatory Visit | Attending: Radiation Oncology | Admitting: Radiation Oncology

## 2022-02-14 DIAGNOSIS — C50411 Malignant neoplasm of upper-outer quadrant of right female breast: Secondary | ICD-10-CM | POA: Diagnosis not present

## 2022-02-14 DIAGNOSIS — Z510 Encounter for antineoplastic radiation therapy: Secondary | ICD-10-CM | POA: Diagnosis not present

## 2022-02-14 DIAGNOSIS — Z170 Estrogen receptor positive status [ER+]: Secondary | ICD-10-CM | POA: Diagnosis not present

## 2022-02-14 LAB — RAD ONC ARIA SESSION SUMMARY
Course Elapsed Days: 15
Plan Fractions Treated to Date: 12
Plan Prescribed Dose Per Fraction: 2.67 Gy
Plan Total Fractions Prescribed: 15
Plan Total Prescribed Dose: 40.05 Gy
Reference Point Dosage Given to Date: 32.04 Gy
Reference Point Session Dosage Given: 2.67 Gy
Session Number: 12

## 2022-02-15 ENCOUNTER — Other Ambulatory Visit: Payer: Self-pay

## 2022-02-15 ENCOUNTER — Ambulatory Visit
Admission: RE | Admit: 2022-02-15 | Discharge: 2022-02-15 | Disposition: A | Discharge status: Home / Self Care | Payer: Medicare Other | Source: Ambulatory Visit | Attending: Radiation Oncology | Admitting: Radiation Oncology

## 2022-02-15 DIAGNOSIS — Z170 Estrogen receptor positive status [ER+]: Secondary | ICD-10-CM | POA: Diagnosis not present

## 2022-02-15 DIAGNOSIS — C50411 Malignant neoplasm of upper-outer quadrant of right female breast: Secondary | ICD-10-CM | POA: Diagnosis not present

## 2022-02-15 DIAGNOSIS — Z510 Encounter for antineoplastic radiation therapy: Secondary | ICD-10-CM | POA: Diagnosis not present

## 2022-02-15 LAB — RAD ONC ARIA SESSION SUMMARY
Course Elapsed Days: 16
Plan Fractions Treated to Date: 13
Plan Prescribed Dose Per Fraction: 2.67 Gy
Plan Total Fractions Prescribed: 15
Plan Total Prescribed Dose: 40.05 Gy
Reference Point Dosage Given to Date: 34.71 Gy
Reference Point Session Dosage Given: 2.67 Gy
Session Number: 13

## 2022-02-16 ENCOUNTER — Other Ambulatory Visit: Payer: Self-pay

## 2022-02-16 ENCOUNTER — Ambulatory Visit
Admission: RE | Admit: 2022-02-16 | Discharge: 2022-02-16 | Disposition: A | Discharge status: Home / Self Care | Payer: Medicare Other | Source: Ambulatory Visit | Attending: Radiation Oncology | Admitting: Radiation Oncology

## 2022-02-16 DIAGNOSIS — C50411 Malignant neoplasm of upper-outer quadrant of right female breast: Secondary | ICD-10-CM | POA: Diagnosis not present

## 2022-02-16 DIAGNOSIS — Z510 Encounter for antineoplastic radiation therapy: Secondary | ICD-10-CM | POA: Diagnosis not present

## 2022-02-16 DIAGNOSIS — Z170 Estrogen receptor positive status [ER+]: Secondary | ICD-10-CM | POA: Diagnosis not present

## 2022-02-16 LAB — RAD ONC ARIA SESSION SUMMARY
Course Elapsed Days: 17
Plan Fractions Treated to Date: 14
Plan Prescribed Dose Per Fraction: 2.67 Gy
Plan Total Fractions Prescribed: 15
Plan Total Prescribed Dose: 40.05 Gy
Reference Point Dosage Given to Date: 37.38 Gy
Reference Point Session Dosage Given: 2.67 Gy
Session Number: 14

## 2022-02-17 ENCOUNTER — Other Ambulatory Visit: Payer: Self-pay

## 2022-02-17 ENCOUNTER — Ambulatory Visit
Admission: RE | Admit: 2022-02-17 | Discharge: 2022-02-17 | Disposition: A | Discharge status: Home / Self Care | Payer: Medicare Other | Source: Ambulatory Visit | Attending: Radiation Oncology | Admitting: Radiation Oncology

## 2022-02-17 DIAGNOSIS — Z170 Estrogen receptor positive status [ER+]: Secondary | ICD-10-CM | POA: Diagnosis not present

## 2022-02-17 DIAGNOSIS — Z0000 Encounter for general adult medical examination without abnormal findings: Secondary | ICD-10-CM | POA: Diagnosis not present

## 2022-02-17 DIAGNOSIS — Z23 Encounter for immunization: Secondary | ICD-10-CM | POA: Diagnosis not present

## 2022-02-17 DIAGNOSIS — Z510 Encounter for antineoplastic radiation therapy: Secondary | ICD-10-CM | POA: Diagnosis not present

## 2022-02-17 DIAGNOSIS — C50411 Malignant neoplasm of upper-outer quadrant of right female breast: Secondary | ICD-10-CM | POA: Diagnosis not present

## 2022-02-17 DIAGNOSIS — Z8542 Personal history of malignant neoplasm of other parts of uterus: Secondary | ICD-10-CM | POA: Diagnosis not present

## 2022-02-17 DIAGNOSIS — M8589 Other specified disorders of bone density and structure, multiple sites: Secondary | ICD-10-CM | POA: Diagnosis not present

## 2022-02-17 DIAGNOSIS — M8580 Other specified disorders of bone density and structure, unspecified site: Secondary | ICD-10-CM | POA: Diagnosis not present

## 2022-02-17 DIAGNOSIS — F325 Major depressive disorder, single episode, in full remission: Secondary | ICD-10-CM | POA: Diagnosis not present

## 2022-02-17 DIAGNOSIS — F900 Attention-deficit hyperactivity disorder, predominantly inattentive type: Secondary | ICD-10-CM | POA: Diagnosis not present

## 2022-02-17 DIAGNOSIS — E785 Hyperlipidemia, unspecified: Secondary | ICD-10-CM | POA: Diagnosis not present

## 2022-02-17 LAB — RAD ONC ARIA SESSION SUMMARY
Course Elapsed Days: 18
Plan Fractions Treated to Date: 15
Plan Prescribed Dose Per Fraction: 2.67 Gy
Plan Total Fractions Prescribed: 15
Plan Total Prescribed Dose: 40.05 Gy
Reference Point Dosage Given to Date: 40.05 Gy
Reference Point Session Dosage Given: 2.67 Gy
Session Number: 15

## 2022-02-20 ENCOUNTER — Ambulatory Visit
Admission: RE | Admit: 2022-02-20 | Discharge: 2022-02-20 | Disposition: A | Discharge status: Home / Self Care | Payer: Medicare Other | Source: Ambulatory Visit | Attending: Radiation Oncology | Admitting: Radiation Oncology

## 2022-02-20 ENCOUNTER — Other Ambulatory Visit: Payer: Self-pay

## 2022-02-20 DIAGNOSIS — C50411 Malignant neoplasm of upper-outer quadrant of right female breast: Secondary | ICD-10-CM | POA: Diagnosis not present

## 2022-02-20 DIAGNOSIS — Z170 Estrogen receptor positive status [ER+]: Secondary | ICD-10-CM | POA: Diagnosis not present

## 2022-02-20 LAB — RAD ONC ARIA SESSION SUMMARY
Course Elapsed Days: 21
Plan Fractions Treated to Date: 1
Plan Prescribed Dose Per Fraction: 2.5 Gy
Plan Total Fractions Prescribed: 3
Plan Total Prescribed Dose: 7.5 Gy
Reference Point Dosage Given to Date: 2.5 Gy
Reference Point Session Dosage Given: 2.5 Gy
Session Number: 16

## 2022-02-21 ENCOUNTER — Ambulatory Visit
Admission: RE | Admit: 2022-02-21 | Discharge: 2022-02-21 | Disposition: A | Discharge status: Home / Self Care | Payer: Medicare Other | Source: Ambulatory Visit | Attending: Radiation Oncology | Admitting: Radiation Oncology

## 2022-02-21 ENCOUNTER — Other Ambulatory Visit: Payer: Self-pay

## 2022-02-21 ENCOUNTER — Encounter: Payer: Self-pay | Admitting: *Deleted

## 2022-02-21 DIAGNOSIS — Z170 Estrogen receptor positive status [ER+]: Secondary | ICD-10-CM | POA: Diagnosis not present

## 2022-02-21 DIAGNOSIS — C50411 Malignant neoplasm of upper-outer quadrant of right female breast: Secondary | ICD-10-CM | POA: Diagnosis not present

## 2022-02-21 LAB — RAD ONC ARIA SESSION SUMMARY
Course Elapsed Days: 22
Plan Fractions Treated to Date: 2
Plan Prescribed Dose Per Fraction: 2.5 Gy
Plan Total Fractions Prescribed: 3
Plan Total Prescribed Dose: 7.5 Gy
Reference Point Dosage Given to Date: 5 Gy
Reference Point Session Dosage Given: 2.5 Gy
Session Number: 17

## 2022-02-22 ENCOUNTER — Other Ambulatory Visit: Payer: Self-pay

## 2022-02-22 ENCOUNTER — Ambulatory Visit
Admission: RE | Admit: 2022-02-22 | Discharge: 2022-02-22 | Disposition: A | Discharge status: Home / Self Care | Payer: Medicare Other | Source: Ambulatory Visit | Attending: Radiation Oncology | Admitting: Radiation Oncology

## 2022-02-22 DIAGNOSIS — Z170 Estrogen receptor positive status [ER+]: Secondary | ICD-10-CM | POA: Diagnosis not present

## 2022-02-22 DIAGNOSIS — Z510 Encounter for antineoplastic radiation therapy: Secondary | ICD-10-CM | POA: Diagnosis not present

## 2022-02-22 DIAGNOSIS — C50411 Malignant neoplasm of upper-outer quadrant of right female breast: Secondary | ICD-10-CM | POA: Diagnosis not present

## 2022-02-22 LAB — RAD ONC ARIA SESSION SUMMARY
Course Elapsed Days: 23
Plan Fractions Treated to Date: 3
Plan Prescribed Dose Per Fraction: 2.5 Gy
Plan Total Fractions Prescribed: 3
Plan Total Prescribed Dose: 7.5 Gy
Reference Point Dosage Given to Date: 7.5 Gy
Reference Point Session Dosage Given: 2.5 Gy
Session Number: 18

## 2022-03-10 NOTE — Radiation Completion Notes (Signed)
Patient Name: Megan Phillips, Megan Phillips MRN: [id number] Date of Birth: November 09, 1950 Referring Physician: Benay Pike, M.D. Date of Service: 2022-03-10 Radiation Oncologist: Eppie Gibson, M.D. [place]                             Radiation Oncology End of Treatment Note     Diagnosis: C50.411 Malignant neoplasm of upper-outer quadrant of right female breast Staging on 2021-12-20: Malignant neoplasm of upper-outer quadrant of right breast in female, estrogen receptor positive ([place]) T=pT1c, N=pNX, M=cM0 Staging on 2021-11-30: Malignant neoplasm of upper-outer quadrant of right breast in female, estrogen receptor positive ([place]) T=cT1c, N=cN0, M=cM0 Intent: Curative     ==========DELIVERED PLANS==========  First Treatment Date: 2022-01-30 - Last Treatment Date: 2022-02-22   Plan Name: Breast_R Site: Breast, Right Technique: 3D Mode: Photon Dose Per Fraction: 2.67 Gy Prescribed Dose (Delivered / Prescribed): 40.05 Gy / 40.05 Gy Prescribed Fxs (Delivered / Prescribed): 15 / 15   Plan Name: Breast_R_Bst Site: Breast, Right Technique: Electron Mode: Electron Dose Per Fraction: 2.5 Gy Prescribed Dose (Delivered / Prescribed): 7.5 Gy / 7.5 Gy Prescribed Fxs (Delivered / Prescribed): 3 / 3     ==========ON TREATMENT VISIT DATES========== 2022-01-30, 2022-02-06, 2022-02-13, 2022-02-20     ==========UPCOMING VISITS==========       ==========APPENDIX - ON TREATMENT VISIT NOTES==========   PatEd 2022-01-30 Ongoing education performed.   ImpPlan 2022-01-30 The patient is tolerating radiation. Continue treatment as planned.   PhysExam 2022-01-30 Alert, no acute distress.   RunningNotes 2022-01-30 01-30-22 education done   ProgNote 2022-01-30 Changes from last week/visit? [ No ] Pain? [ No ] Fatigue? [ No ] Skin irritation? [ No ] Using lotions? [ No ] Lymphedema? [ No ] Issues with ROM? [ No ] Need refills: [ No ] Additional  Weekly Progress  Notes [ no major issues at this time ]    PatEd 2022-02-06 Ongoing education performed.   ImpPlan 2022-02-06 The patient is tolerating radiation. Continue treatment as planned.   PhysExam 2022-02-06 Alert, no acute distress.   ProgNote 2022-02-06 Changes from last week/visit? [ No ] Pain? [ Yes ] Fatigue? [ Yes, mild fatigue ] Skin irritation? [ No ] Using lotions? [ Yes ] Lymphedema? [ No ] Issues with ROM? [ No ] Need refills: [ No ] Additional  Weekly Progress Notes [ no concerns ]    PatEd 2022-02-13 Ongoing education performed.   ImpPlan 2022-02-13 The patient is tolerating radiation. Continue treatment as planned.   PhysExam 2022-02-13 Alert, no acute distress.   ProgNote 2022-02-13 Changes from last week/visit? [ Yes ] Pain? [ No ] Fatigue? [ No ] Skin irritation? [ Yes, maybe  a little more irritated ] Using lotions? [ Yes ] Lymphedema? [ No ] Issues with ROM? [ No ] Need refills: [ No ] Additional  Weekly Progress Notes [ had nausea on Thursday ]    PatEd 2022-02-20 Ongoing education performed.   ImpPlan 2022-02-20 The patient is tolerating radiation. Continue treatment as planned.   PhysExam 2022-02-20 Alert, no acute distress.   ProgNote 2022-02-20 Changes from last week/visit? [ No ] Pain? [ No ] Fatigue? [ Yes- Mild ] Skin irritation? [ No ] Using lotions? [ No ] Lymphedema? [ No ] Issues with ROM? [ No ] Need refills: [ No ] Additional  Weekly Progress Notes [  ]

## 2022-03-23 ENCOUNTER — Encounter (HOSPITAL_COMMUNITY): Payer: Self-pay

## 2022-03-28 ENCOUNTER — Encounter: Payer: Self-pay | Admitting: Hematology and Oncology

## 2022-03-30 ENCOUNTER — Telehealth: Payer: Self-pay

## 2022-03-30 ENCOUNTER — Encounter: Payer: Self-pay | Admitting: Radiation Oncology

## 2022-03-30 ENCOUNTER — Ambulatory Visit
Admission: RE | Admit: 2022-03-30 | Discharge: 2022-03-30 | Disposition: A | Discharge status: Home / Self Care | Payer: Medicare Other | Source: Ambulatory Visit | Attending: Radiation Oncology | Admitting: Radiation Oncology

## 2022-03-30 NOTE — Telephone Encounter (Signed)
I called the patient today about her upcoming follow-up appointment in radiation oncology.   Given the state of the COVID-19 pandemic, concerning case numbers in our community, and guidance from [facility], I offered a phone assessment with the patient to determine if coming to the clinic was necessary. She accepted.  The patient denies any symptomatic concerns.  Specifically, they report good healing of their skin in the radiation fields.  Skin is intact.  Pt denied pain, her skin is healing well, and reports on ROM issues. Pt also denied swelling. Pt stated she is exercising well, she did a three mile run this am. Pt stated she is appreciative of the great care that she received from Dr. Isidore Moos and the team.   I recommended that she continue skin care by applying oil or lotion with vitamin E to the skin in the radiation fields, BID, for 2 more months.  Continue follow-up with medical oncology - follow-up is scheduled on 06-19-22 with Wilber Bihari.  I explained that yearly mammograms are important for patients with intact breast tissue, and physical exams are important after mastectomy for patients that cannot undergo mammography.  I encouraged her to call if she had further questions or concerns about her healing. Otherwise, she will follow-up PRN in radiation oncology. Patient is pleased with this plan, and we will cancel her upcoming follow-up to reduce the risk of COVID-19 transmission.

## 2022-04-11 ENCOUNTER — Telehealth: Payer: Self-pay | Admitting: Hematology and Oncology

## 2022-04-11 NOTE — Telephone Encounter (Signed)
Spoke with patient confirming upcoming appointments  

## 2022-04-18 DIAGNOSIS — M816 Localized osteoporosis [Lequesne]: Secondary | ICD-10-CM | POA: Diagnosis not present

## 2022-04-18 DIAGNOSIS — N958 Other specified menopausal and perimenopausal disorders: Secondary | ICD-10-CM | POA: Diagnosis not present

## 2022-05-05 ENCOUNTER — Encounter: Payer: Self-pay | Admitting: Hematology and Oncology

## 2022-05-10 ENCOUNTER — Other Ambulatory Visit: Payer: Self-pay | Admitting: Internal Medicine

## 2022-05-10 ENCOUNTER — Ambulatory Visit
Admission: RE | Admit: 2022-05-10 | Discharge: 2022-05-10 | Disposition: A | Discharge status: Home / Self Care | Payer: Medicare Other | Source: Ambulatory Visit | Attending: Internal Medicine | Admitting: Internal Medicine

## 2022-05-10 DIAGNOSIS — M25551 Pain in right hip: Secondary | ICD-10-CM

## 2022-05-10 DIAGNOSIS — M7061 Trochanteric bursitis, right hip: Secondary | ICD-10-CM | POA: Diagnosis not present

## 2022-05-24 ENCOUNTER — Encounter: Payer: Self-pay | Admitting: Hematology and Oncology

## 2022-06-15 ENCOUNTER — Encounter: Payer: Self-pay | Admitting: Hematology and Oncology

## 2022-06-15 ENCOUNTER — Other Ambulatory Visit: Payer: Self-pay | Admitting: *Deleted

## 2022-06-16 ENCOUNTER — Telehealth: Payer: Self-pay | Admitting: Hematology and Oncology

## 2022-06-16 NOTE — Telephone Encounter (Signed)
Spoke with patient confirming upcoming appointment  

## 2022-06-19 ENCOUNTER — Encounter: Payer: Self-pay | Admitting: Adult Health

## 2022-06-19 ENCOUNTER — Inpatient Hospital Stay: Payer: Medicare Other | Attending: Adult Health | Admitting: Adult Health

## 2022-06-19 ENCOUNTER — Inpatient Hospital Stay: Payer: Medicare Other

## 2022-06-19 ENCOUNTER — Other Ambulatory Visit: Payer: Self-pay

## 2022-06-19 VITALS — BP 104/61 | HR 76 | Temp 99.3°F | Resp 18 | Ht 63.0 in | Wt 111.8 lb

## 2022-06-19 DIAGNOSIS — Z923 Personal history of irradiation: Secondary | ICD-10-CM | POA: Insufficient documentation

## 2022-06-19 DIAGNOSIS — C50411 Malignant neoplasm of upper-outer quadrant of right female breast: Secondary | ICD-10-CM

## 2022-06-19 DIAGNOSIS — Z79810 Long term (current) use of selective estrogen receptor modulators (SERMs): Secondary | ICD-10-CM | POA: Insufficient documentation

## 2022-06-19 DIAGNOSIS — Z79899 Other long term (current) drug therapy: Secondary | ICD-10-CM | POA: Diagnosis not present

## 2022-06-19 DIAGNOSIS — Z170 Estrogen receptor positive status [ER+]: Secondary | ICD-10-CM | POA: Diagnosis not present

## 2022-06-19 LAB — CMP (CANCER CENTER ONLY)
ALT: 9 U/L (ref 0–44)
AST: 16 U/L (ref 15–41)
Albumin: 4 g/dL (ref 3.5–5.0)
Alkaline Phosphatase: 58 U/L (ref 38–126)
Anion gap: 4 — ABNORMAL LOW (ref 5–15)
BUN: 16 mg/dL (ref 8–23)
CO2: 27 mmol/L (ref 22–32)
Calcium: 9.3 mg/dL (ref 8.9–10.3)
Chloride: 108 mmol/L (ref 98–111)
Creatinine: 0.89 mg/dL (ref 0.44–1.00)
GFR, Estimated: >60 mL/min (ref >60)
Glucose, Bld: 94 mg/dL (ref 70–99)
Potassium: 4.3 mmol/L (ref 3.5–5.1)
Sodium: 139 mmol/L (ref 135–145)
Total Bilirubin: 0.5 mg/dL (ref 0.3–1.2)
Total Protein: 6.6 g/dL (ref 6.5–8.1)

## 2022-06-19 LAB — CBC WITH DIFFERENTIAL (CANCER CENTER ONLY)
Abs Immature Granulocytes: 0.02 10*3/uL (ref 0.00–0.07)
Basophils Absolute: 0.1 10*3/uL (ref 0.0–0.1)
Basophils Relative: 1 %
Eosinophils Absolute: 0.3 10*3/uL (ref 0.0–0.5)
Eosinophils Relative: 4 %
HCT: 37.2 % (ref 36.0–46.0)
Hemoglobin: 12.7 g/dL (ref 12.0–15.0)
Immature Granulocytes: 0 %
Lymphocytes Relative: 18 %
Lymphs Abs: 1.3 10*3/uL (ref 0.7–4.0)
MCH: 31 pg (ref 26.0–34.0)
MCHC: 34.1 g/dL (ref 30.0–36.0)
MCV: 90.7 fL (ref 80.0–100.0)
Monocytes Absolute: 0.6 10*3/uL (ref 0.1–1.0)
Monocytes Relative: 8 %
Neutro Abs: 5 10*3/uL (ref 1.7–7.7)
Neutrophils Relative %: 69 %
Platelet Count: 307 10*3/uL (ref 150–400)
RBC: 4.1 MIL/uL (ref 3.87–5.11)
RDW: 12.2 % (ref 11.5–15.5)
WBC Count: 7.2 10*3/uL (ref 4.0–10.5)
nRBC: 0 % (ref 0.0–0.2)

## 2022-06-19 NOTE — Progress Notes (Signed)
MM order faxed to [facility] per NP. Fax confirmation received.

## 2022-06-19 NOTE — Progress Notes (Signed)
SURVIVORSHIP VISIT:   BRIEF ONCOLOGIC HISTORY:  Oncology History  Malignant neoplasm of upper-outer quadrant of right breast in female, estrogen receptor positive  11/14/2021 Mammogram   Screening mammogram showed possible mass in the right breast, indeterminate.  Diagnostic mammogram showed a 1.7 cm irregular mass in the right breast suspicious of malignancy.   11/23/2021 Pathology Results   Pathology from the right breast needle core biopsy showed invasive ductal carcinoma, grade 2, prognostic showed ER 100% positive strong staining PR 100% positive strong staining, Ki-67 of 10% and HER2 of 1+   11/28/2021 Initial Diagnosis   Malignant neoplasm of upper-outer quadrant of right breast in female, estrogen receptor positive (HCC)   11/30/2021 Cancer Staging   Staging form: Breast, AJCC 8th Edition - Clinical stage from 11/30/2021: Stage IA (cT1c, cN0, cM0, G2, ER+, PR+, HER2-) - Signed by Rachel Moulds, MD on 11/30/2021 Stage prefix: Initial diagnosis Histologic grading system: 3 grade system Laterality: Right Staged by: Pathologist and managing physician Stage used in treatment planning: Yes National guidelines used in treatment planning: Yes Type of national guideline used in treatment planning: NCCN   12/13/2021 Genetic Testing   Negative genetic testing: no pathogenic variants detected in [facility] CancerNextExpanded +RNAinsight Panel.  VUS in MSH3 at  p.V682L (c.2044G>C).  Report date is 12/13/2021.   The CancerNext-Expanded gene panel offered by [facility] and includes sequencing, rearrangement, and RNA analysis for the following 77 genes: AIP, ALK, APC, ATM, AXIN2, BAP1, BARD1, BLM, BMPR1A, BRCA1, BRCA2, BRIP1, CDC73, CDH1, CDK4, CDKN1B, CDKN2A, CHEK2, CTNNA1, DICER1, FANCC, FH, FLCN, GALNT12, KIF1B, LZTR1, MAX, MEN1, MET, MLH1, MSH2, MSH3, MSH6, MUTYH, NBN, NF1, NF2, NTHL1, PALB2, PHOX2B, PMS2, POT1, PRKAR1A, PTCH1, PTEN, RAD51C, RAD51D, RB1, RECQL, RET, SDHA, SDHAF2, SDHB, SDHC,  SDHD, SMAD4, SMARCA4, SMARCB1, SMARCE1, STK11, SUFU, TMEM127, TP53, TSC1, TSC2, VHL and XRCC2 (sequencing and deletion/duplication); EGFR, EGLN1, HOXB13, KIT, MITF, PDGFRA, POLD1, and POLE (sequencing only); EPCAM and GREM1 (deletion/duplication only).    12/20/2021 Cancer Staging   Staging form: Breast, AJCC 8th Edition - Pathologic stage from 12/20/2021: Stage IA (pT1c, pN0, cM0, G2, ER+, PR+, HER2-, Oncotype DX score: 10) - Signed by Loa Socks, NP on 06/19/2022 Stage prefix: Initial diagnosis Multigene prognostic tests performed: Oncotype DX Recurrence score range: Less than 11 Histologic grading system: 3 grade system   01/30/2022 - 02/22/2022 Radiation Therapy   Plan Name: Breast_R Site: Breast, Right Technique: 3D Mode: Photon Dose Per Fraction: 2.67 Gy Prescribed Dose (Delivered / Prescribed): 40.05 Gy / 40.05 Gy Prescribed Fxs (Delivered / Prescribed): 15 / 15   Plan Name: Breast_R_Bst Site: Breast, Right Technique: Electron Mode: Electron Dose Per Fraction: 2.5 Gy Prescribed Dose (Delivered / Prescribed): 7.5 Gy / 7.5 Gy Prescribed Fxs (Delivered / Prescribed): 3 / 3   01/2022 -  Anti-estrogen oral therapy   Tamoxifen     INTERVAL HISTORY:  Ms. Waits to review her survivorship care plan detailing her treatment course for breast cancer, as well as monitoring long-term side effects of that treatment, education regarding health maintenance, screening, and overall wellness and health promotion.     Overall, Ms. Wysong reports feeling quite well.  She stopped taking tamoxifen because she began experiencing joint pain particularly in her right hip.  She was diagnosed with bursitis and once she stopped the tamoxifen she notes that this improved.  She has an appointment with sports medicine next week.  She wants to meet with Dr. Al Pimple which is scheduled for later this month to review other options  for antiestrogen therapy.  REVIEW OF SYSTEMS:  Review of Systems   Constitutional:  Negative for appetite change, chills, fatigue, fever and unexpected weight change.  HENT:   Negative for hearing loss, lump/mass and trouble swallowing.   Eyes:  Negative for eye problems and icterus.  Respiratory:  Negative for chest tightness, cough and shortness of breath.   Cardiovascular:  Negative for chest pain, leg swelling and palpitations.  Gastrointestinal:  Negative for abdominal distention, abdominal pain, constipation, diarrhea, nausea and vomiting.  Endocrine: Negative for hot flashes.  Genitourinary:  Negative for difficulty urinating.   Musculoskeletal:  Positive for arthralgias.  Skin:  Negative for itching and rash.  Neurological:  Negative for dizziness, extremity weakness, headaches and numbness.  Hematological:  Negative for adenopathy. Does not bruise/bleed easily.  Psychiatric/Behavioral:  Negative for depression. The patient is not nervous/anxious.   Breast: Denies any new nodularity, masses, tenderness, nipple changes, or nipple discharge.      PAST MEDICAL/SURGICAL HISTORY:  Past Medical History:  Diagnosis Date   ADHD (attention deficit hyperactivity disorder)    Anxiety    Breast cancer (HCC) 11/2021   right breast IDC   Complication of anesthesia    Depression    Endometrioid adenocarcinoma 06/2009   Stage 1A grade 1   Family history of breast cancer 11/30/2021   History of endometrial cancer 11/30/2021   PONV (postoperative nausea and vomiting)    Past Surgical History:  Procedure Laterality Date   ABDOMINAL HYSTERECTOMY  06/04/2009   Robotic Hyst, BSO, pelvic lymph node dissection   BREAST LUMPECTOMY WITH RADIOACTIVE SEED LOCALIZATION Right 12/20/2021   Procedure: RIGHT BREAST LUMPECTOMY WITH RADIOACTIVE SEED LOCALIZATION;  Surgeon: Harriette Bouillon, MD;  Location: [facility];  Service: General;  Laterality: Right;   COLONOSCOPY     DILATION AND CURETTAGE OF UTERUS       ALLERGIES:  No Known  Allergies   CURRENT MEDICATIONS:  Outpatient Encounter Medications as of 06/19/2022  Medication Sig   escitalopram (LEXAPRO) 10 MG tablet 10 mg daily.   fexofenadine (ALLEGRA ALLERGY) 180 MG tablet Take 1 tablet every day by oral route.   methylphenidate (RITALIN) 5 MG tablet Take 1 tablet by mouth 2 (two) times daily.   tamoxifen (NOLVADEX) 20 MG tablet Take 1 tablet (20 mg total) by mouth daily. (Patient not taking: Reported on 06/19/2022)   [DISCONTINUED] methylphenidate (RITALIN) 5 MG tablet    [DISCONTINUED] oxyCODONE (OXY IR/ROXICODONE) 5 MG immediate release tablet Take 1 tablet (5 mg total) by mouth every 6 (six) hours as needed for severe pain. (Patient not taking: Reported on 01/17/2022)   [DISCONTINUED] sertraline (ZOLOFT) 25 MG tablet Take 50 mg by mouth daily.   No facility-administered encounter medications on file as of 06/19/2022.     ONCOLOGIC FAMILY HISTORY:  Family History  Problem Relation Age of Onset   Hyperlipidemia Mother    Heart disease Mother    Stroke Mother    Uterine cancer Mother 51       ? or other reason for hysterectomy   Heart disease Father    Heart disease Brother    Breast cancer Maternal Aunt        dx 29s   Breast cancer Other        mat aunt's granddaughter; dx 73s     SOCIAL HISTORY:  Social History   Socioeconomic History   Marital status: Married    Spouse name: Not on file   Number of children: Not on  file   Years of education: Not on file   Highest education level: Not on file  Occupational History   Not on file  Tobacco Use   Smoking status: Never   Smokeless tobacco: Not on file  Substance and Sexual Activity   Alcohol use: Yes    Comment: Few glasses of wine a week   Drug use: Never   Sexual activity: Yes    Birth control/protection: Surgical    Comment: hyst  Other Topics Concern   Not on file  Social History Narrative   Not on file   Social Determinants of Health   Financial Resource Strain: Low Risk   (11/30/2021)   Overall Financial Resource Strain (CARDIA)    Difficulty of Paying Living Expenses: Not hard at all  Food Insecurity: No Food Insecurity (11/30/2021)   Hunger Vital Sign    Worried About Running Out of Food in the Last Year: Never true    Ran Out of Food in the Last Year: Never true  Transportation Needs: No Transportation Needs (11/30/2021)   PRAPARE - Administrator, Civil Service (Medical): No    Lack of Transportation (Non-Medical): No  Physical Activity: Not on file  Stress: Not on file  Social Connections: Not on file  Intimate Partner Violence: Not on file     OBSERVATIONS/OBJECTIVE:  BP 104/61 (BP Location: Right Arm, Patient Position: Sitting)   Pulse 76   Temp 99.3 F (37.4 C) (Tympanic)   Resp 18   Ht 5\' 3"  (1.6 m)   Wt 111 lb 12.8 oz (50.7 kg)   SpO2 100%   BMI 19.80 kg/m  GENERAL: Patient is a well appearing female in no acute distress HEENT:  Sclerae anicteric.  Oropharynx clear and moist. No ulcerations or evidence of oropharyngeal candidiasis. Neck is supple.  NODES:  No cervical, supraclavicular, or axillary lymphadenopathy palpated.  BREAST EXAM:  right breast s/p lumpectomy and radiation, no sign of local recurrence, left breast benign LUNGS:  Clear to auscultation bilaterally.  No wheezes or rhonchi. HEART:  Regular rate and rhythm. No murmur appreciated. ABDOMEN:  Soft, nontender.  Positive, normoactive bowel sounds. No organomegaly palpated. MSK:  No focal spinal tenderness to palpation. Full range of motion bilaterally in the upper extremities. EXTREMITIES:  No peripheral edema.   SKIN:  Clear with no obvious rashes or skin changes. No nail dyscrasia. NEURO:  Nonfocal. Well oriented.  Appropriate affect.   LABORATORY DATA:  None for this visit.  DIAGNOSTIC IMAGING:  None for this visit.      ASSESSMENT AND PLAN:  Ms.. Megan Phillips is a pleasant 72 y.o. female with Stage IA right breast invasive ductal carcinoma,  ER+/PR+/HER2-, diagnosed in 11/2021, treated with lumpectomy, adjuvant radiation therapy, and anti-estrogen therapy with Tamoxifen beginning in 01/2022.  She presents to the Survivorship Clinic for our initial meeting and routine follow-up post-completion of treatment for breast cancer.    1. Stage IA right breast cancer:  Ms. Toole is continuing to recover from definitive treatment for breast cancer. She will follow-up with her medical oncologist, Dr. Al Pimple in 06/2022 and 12/2022 with history and physical exam per surveillance protocol.  Her mammogram is due 11/2022; orders placed today. Today, a comprehensive survivorship care plan and treatment summary was reviewed with the patient today detailing her breast cancer diagnosis, treatment course, potential late/long-term effects of treatment, appropriate follow-up care with recommendations for the future, and patient education resources.  A copy of this summary, along with a letter  will be sent to the patient's primary care provider via mail/fax/In Basket message after today's visit.    2. Arthralgias: Tamoxifen on hold, to discuss with Dr. Al Pimple further later this month.  I gave her information about side effects of the aromatase inhibitors as well today.   3. Bone health: Elaine's bone density testing was completed on April 18, 2022 demonstrating osteoporosis with a T-score of -2.5 in the AP spine.  We discussed how tamoxifen has a protective effect on the bones and I recommend continued follow-up with physicians for women in obtaining her bone density testing every 2 years.  4. Cancer screening:  Due to Ms. Dobbs's history and her age, she should receive screening for skin cancers, colon cancer.  The information and recommendations are listed on the patient's comprehensive care plan/treatment summary and were reviewed in detail with the patient.    5. Health maintenance and wellness promotion: Ms. Ciresi was encouraged to consume 5-7 servings of  fruits and vegetables per day. We reviewed the "Nutrition Rainbow" handout.  She was also encouraged to engage in moderate to vigorous exercise for 30 minutes per day most days of the week. Marland Kitchen  She was instructed to limit her alcohol consumption and continue to abstain from tobacco use.     6. Support services/counseling: It is not uncommon for this period of the patient's cancer care trajectory to be one of many emotions and stressors.   She was given information regarding our available services and encouraged to contact me with any questions or for help enrolling in any of our support group/programs.    Follow up instructions:    -Return to cancer center in   -Mammogram due in 11/2022 -Bone density recommended every 2 years; next due 04/2024 -She is welcome to return back to the Survivorship Clinic at any time; no additional follow-up needed at this time.  -Consider referral back to survivorship as a long-term survivor for continued surveillance  The patient was provided an opportunity to ask questions and all were answered. The patient agreed with the plan and demonstrated an understanding of the instructions.   Total encounter time:40 minutes*in face-to-face visit time, chart review, lab review, care coordination, order entry, and documentation of the encounter time.    Lillard Anes, NP 06/19/22 9:56 AM Medical Oncology and Hematology [facility] [address] [place], [place] [address] Tel. [phone]    Fax. [phone]  *Total Encounter Time as defined by the Centers for Medicare and Medicaid Services includes, in addition to the face-to-face time of a patient visit (documented in the note above) non-face-to-face time: obtaining and reviewing outside history, ordering and reviewing medications, tests or procedures, care coordination (communications with other health care professionals or caregivers) and documentation in the medical record.

## 2022-06-27 ENCOUNTER — Telehealth: Payer: Self-pay | Admitting: Hematology and Oncology

## 2022-06-27 DIAGNOSIS — M25551 Pain in right hip: Secondary | ICD-10-CM | POA: Diagnosis not present

## 2022-06-27 NOTE — Telephone Encounter (Signed)
Patient called to cancel her appointment with [facility].

## 2022-06-30 ENCOUNTER — Inpatient Hospital Stay: Payer: Medicare Other | Admitting: Hematology and Oncology

## 2022-08-14 DIAGNOSIS — Z170 Estrogen receptor positive status [ER+]: Secondary | ICD-10-CM | POA: Diagnosis not present

## 2022-08-14 DIAGNOSIS — C50411 Malignant neoplasm of upper-outer quadrant of right female breast: Secondary | ICD-10-CM | POA: Diagnosis not present

## 2022-09-27 DIAGNOSIS — M1611 Unilateral primary osteoarthritis, right hip: Secondary | ICD-10-CM | POA: Diagnosis not present

## 2022-10-19 DIAGNOSIS — Z1272 Encounter for screening for malignant neoplasm of vagina: Secondary | ICD-10-CM | POA: Diagnosis not present

## 2022-10-19 DIAGNOSIS — Z8542 Personal history of malignant neoplasm of other parts of uterus: Secondary | ICD-10-CM | POA: Diagnosis not present

## 2022-10-19 DIAGNOSIS — Z681 Body mass index (BMI) 19 or less, adult: Secondary | ICD-10-CM | POA: Diagnosis not present

## 2022-10-19 DIAGNOSIS — Z853 Personal history of malignant neoplasm of breast: Secondary | ICD-10-CM | POA: Diagnosis not present

## 2022-10-19 DIAGNOSIS — Z124 Encounter for screening for malignant neoplasm of cervix: Secondary | ICD-10-CM | POA: Diagnosis not present

## 2022-11-17 DIAGNOSIS — Z23 Encounter for immunization: Secondary | ICD-10-CM | POA: Diagnosis not present

## 2022-12-12 DIAGNOSIS — H52203 Unspecified astigmatism, bilateral: Secondary | ICD-10-CM | POA: Diagnosis not present

## 2022-12-12 DIAGNOSIS — H43811 Vitreous degeneration, right eye: Secondary | ICD-10-CM | POA: Diagnosis not present

## 2022-12-12 DIAGNOSIS — H2513 Age-related nuclear cataract, bilateral: Secondary | ICD-10-CM | POA: Diagnosis not present

## 2022-12-16 DIAGNOSIS — Z23 Encounter for immunization: Secondary | ICD-10-CM | POA: Diagnosis not present

## 2022-12-21 DIAGNOSIS — Z853 Personal history of malignant neoplasm of breast: Secondary | ICD-10-CM | POA: Diagnosis not present

## 2022-12-22 ENCOUNTER — Encounter: Payer: Self-pay | Admitting: Adult Health

## 2023-02-21 DIAGNOSIS — Z853 Personal history of malignant neoplasm of breast: Secondary | ICD-10-CM | POA: Diagnosis not present

## 2023-02-21 DIAGNOSIS — F900 Attention-deficit hyperactivity disorder, predominantly inattentive type: Secondary | ICD-10-CM | POA: Diagnosis not present

## 2023-02-21 DIAGNOSIS — Z8542 Personal history of malignant neoplasm of other parts of uterus: Secondary | ICD-10-CM | POA: Diagnosis not present

## 2023-02-21 DIAGNOSIS — E785 Hyperlipidemia, unspecified: Secondary | ICD-10-CM | POA: Diagnosis not present

## 2023-02-21 DIAGNOSIS — Z0000 Encounter for general adult medical examination without abnormal findings: Secondary | ICD-10-CM | POA: Diagnosis not present

## 2023-02-21 DIAGNOSIS — F325 Major depressive disorder, single episode, in full remission: Secondary | ICD-10-CM | POA: Diagnosis not present

## 2023-02-21 DIAGNOSIS — M810 Age-related osteoporosis without current pathological fracture: Secondary | ICD-10-CM | POA: Diagnosis not present

## 2023-04-11 ENCOUNTER — Inpatient Hospital Stay: Payer: Medicare Other | Attending: Hematology and Oncology | Admitting: Hematology and Oncology

## 2023-04-11 ENCOUNTER — Encounter: Payer: Self-pay | Admitting: Hematology and Oncology

## 2023-04-11 VITALS — BP 126/64 | HR 85 | Temp 97.9°F | Resp 16 | Wt 109.6 lb

## 2023-04-11 DIAGNOSIS — Z170 Estrogen receptor positive status [ER+]: Secondary | ICD-10-CM | POA: Insufficient documentation

## 2023-04-11 DIAGNOSIS — Z1732 Human epidermal growth factor receptor 2 negative status: Secondary | ICD-10-CM | POA: Insufficient documentation

## 2023-04-11 DIAGNOSIS — Z79899 Other long term (current) drug therapy: Secondary | ICD-10-CM | POA: Diagnosis not present

## 2023-04-11 DIAGNOSIS — Z1721 Progesterone receptor positive status: Secondary | ICD-10-CM | POA: Insufficient documentation

## 2023-04-11 DIAGNOSIS — Z923 Personal history of irradiation: Secondary | ICD-10-CM | POA: Insufficient documentation

## 2023-04-11 DIAGNOSIS — Z79810 Long term (current) use of selective estrogen receptor modulators (SERMs): Secondary | ICD-10-CM | POA: Insufficient documentation

## 2023-04-11 DIAGNOSIS — C50411 Malignant neoplasm of upper-outer quadrant of right female breast: Secondary | ICD-10-CM | POA: Diagnosis not present

## 2023-04-11 NOTE — Progress Notes (Signed)
 BRIEF ONCOLOGIC HISTORY:  Oncology History  Malignant neoplasm of upper-outer quadrant of right breast in female, estrogen receptor positive (HCC)  11/14/2021 Mammogram   Screening mammogram showed possible mass in the right breast, indeterminate.  Diagnostic mammogram showed a 1.7 cm irregular mass in the right breast suspicious of malignancy.   11/23/2021 Pathology Results   Pathology from the right breast needle core biopsy showed invasive ductal carcinoma, grade 2, prognostic showed ER 100% positive strong staining PR 100% positive strong staining, Ki-67 of 10% and HER2 of 1+   11/28/2021 Initial Diagnosis   Malignant neoplasm of upper-outer quadrant of right breast in female, estrogen receptor positive (HCC)   11/30/2021 Cancer Staging   Staging form: Breast, AJCC 8th Edition - Clinical stage from 11/30/2021: Stage IA (cT1c, cN0, cM0, G2, ER+, PR+, HER2-) - Signed by Loretha Ash, MD on 11/30/2021 Stage prefix: Initial diagnosis Histologic grading system: 3 grade system Laterality: Right Staged by: Pathologist and managing physician Stage used in treatment planning: Yes National guidelines used in treatment planning: Yes Type of national guideline used in treatment planning: NCCN   12/13/2021 Genetic Testing   Negative genetic testing: no pathogenic variants detected in [facility] CancerNextExpanded +RNAinsight Panel.  VUS in MSH3 at  p.V682L (c.2044G>C).  Report date is 12/13/2021.   The CancerNext-Expanded gene panel offered by [facility] and includes sequencing, rearrangement, and RNA analysis for the following 77 genes: AIP, ALK, APC, ATM, AXIN2, BAP1, BARD1, BLM, BMPR1A, BRCA1, BRCA2, BRIP1, CDC73, CDH1, CDK4, CDKN1B, CDKN2A, CHEK2, CTNNA1, DICER1, FANCC, FH, FLCN, GALNT12, KIF1B, LZTR1, MAX, MEN1, MET, MLH1, MSH2, MSH3, MSH6, MUTYH, NBN, NF1, NF2, NTHL1, PALB2, PHOX2B, PMS2, POT1, PRKAR1A, PTCH1, PTEN, RAD51C, RAD51D, RB1, RECQL, RET, SDHA, SDHAF2, SDHB, SDHC, SDHD, SMAD4, SMARCA4,  SMARCB1, SMARCE1, STK11, SUFU, TMEM127, TP53, TSC1, TSC2, VHL and XRCC2 (sequencing and deletion/duplication); EGFR, EGLN1, HOXB13, KIT, MITF, PDGFRA, POLD1, and POLE (sequencing only); EPCAM and GREM1 (deletion/duplication only).    12/20/2021 Cancer Staging   Staging form: Breast, AJCC 8th Edition - Pathologic stage from 12/20/2021: Stage IA (pT1c, pN0, cM0, G2, ER+, PR+, HER2-, Oncotype DX score: 10) - Signed by Crawford Morna Pickle, NP on 06/19/2022 Stage prefix: Initial diagnosis Multigene prognostic tests performed: Oncotype DX Recurrence score range: Less than 11 Histologic grading system: 3 grade system   01/30/2022 - 02/22/2022 Radiation Therapy   Plan Name: Breast_R Site: Breast, Right Technique: 3D Mode: Photon Dose Per Fraction: 2.67 Gy Prescribed Dose (Delivered / Prescribed): 40.05 Gy / 40.05 Gy Prescribed Fxs (Delivered / Prescribed): 15 / 15   Plan Name: Breast_R_Bst Site: Breast, Right Technique: Electron Mode: Electron Dose Per Fraction: 2.5 Gy Prescribed Dose (Delivered / Prescribed): 7.5 Gy / 7.5 Gy Prescribed Fxs (Delivered / Prescribed): 3 / 3   01/2022 -  Anti-estrogen oral therapy   Tamoxifen      INTERVAL HISTORY:   Patient is here for a follow-up by herself.  Since her last visit, she has not been taking the tamoxifen .  She tried 20, 10 mg and 10 mg every other day and continued to have some arthralgias.  She also was also diagnosed with bursitis of the right leg around the same time which has slowly improved.  She is now back to jogging about 2 miles 2-3 times a week.  She feels really well.  She had a mammogram end of October last year which was without any evidence of malignancy.  She feels motivated to try tamoxifen  from a different manufacturer to see if this makes a  difference.  She is however not very interested in trying aromatase inhibitors.  She understands her risk of breast cancer recurrence is small.  Rest of the pertinent 10 point ROS  reviewed and negative  PAST MEDICAL/SURGICAL HISTORY:  Past Medical History:  Diagnosis Date   ADHD (attention deficit hyperactivity disorder)    Anxiety    Breast cancer (HCC) 11/2021   right breast IDC   Complication of anesthesia    Depression    Endometrioid adenocarcinoma 06/2009   Stage 1A grade 1   Family history of breast cancer 11/30/2021   History of endometrial cancer 11/30/2021   PONV (postoperative nausea and vomiting)    Past Surgical History:  Procedure Laterality Date   ABDOMINAL HYSTERECTOMY  06/04/2009   Robotic Hyst, BSO, pelvic lymph node dissection   BREAST LUMPECTOMY WITH RADIOACTIVE SEED LOCALIZATION Right 12/20/2021   Procedure: RIGHT BREAST LUMPECTOMY WITH RADIOACTIVE SEED LOCALIZATION;  Surgeon: Vanderbilt Ned, MD;  Location: [facility];  Service: General;  Laterality: Right;   COLONOSCOPY     DILATION AND CURETTAGE OF UTERUS       ALLERGIES:  No Known Allergies   CURRENT MEDICATIONS:  Outpatient Encounter Medications as of 04/11/2023  Medication Sig   escitalopram (LEXAPRO) 10 MG tablet 10 mg daily.   fexofenadine (ALLEGRA ALLERGY) 180 MG tablet Take 1 tablet every day by oral route.   methylphenidate (RITALIN) 5 MG tablet Take 1 tablet by mouth 2 (two) times daily.   tamoxifen  (NOLVADEX ) 20 MG tablet Take 1 tablet (20 mg total) by mouth daily. (Patient not taking: Reported on 06/19/2022)   No facility-administered encounter medications on file as of 04/11/2023.     ONCOLOGIC FAMILY HISTORY:  Family History  Problem Relation Age of Onset   Hyperlipidemia Mother    Heart disease Mother    Stroke Mother    Uterine cancer Mother 32       ? or other reason for hysterectomy   Heart disease Father    Heart disease Brother    Breast cancer Maternal Aunt        dx 24s   Breast cancer Other        mat aunt's granddaughter; dx 17s     SOCIAL HISTORY:  Social History   Socioeconomic History   Marital status: Married     Spouse name: Not on file   Number of children: Not on file   Years of education: Not on file   Highest education level: Not on file  Occupational History   Not on file  Tobacco Use   Smoking status: Never   Smokeless tobacco: Not on file  Substance and Sexual Activity   Alcohol use: Yes    Comment: Few glasses of wine a week   Drug use: Never   Sexual activity: Yes    Birth control/protection: Surgical    Comment: hyst  Other Topics Concern   Not on file  Social History Narrative   Not on file   Social Drivers of Health   Financial Resource Strain: Low Risk  (11/30/2021)   Overall Financial Resource Strain (CARDIA)    Difficulty of Paying Living Expenses: Not hard at all  Food Insecurity: No Food Insecurity (11/30/2021)   Hunger Vital Sign    Worried About Running Out of Food in the Last Year: Never true    Ran Out of Food in the Last Year: Never true  Transportation Needs: No Transportation Needs (11/30/2021)   PRAPARE - Transportation  Lack of Transportation (Medical): No    Lack of Transportation (Non-Medical): No  Physical Activity: Not on file  Stress: Not on file  Social Connections: Not on file  Intimate Partner Violence: Not on file     OBSERVATIONS/OBJECTIVE:  BP 126/64 (BP Location: Left Arm, Patient Position: Sitting)   Pulse 85   Temp 97.9 F (36.6 C) (Temporal)   Resp 16   Wt 109 lb 9.6 oz (49.7 kg)   SpO2 98%   BMI 19.41 kg/m  GENERAL: Patient is a well appearing female in no acute distress HEENT:  Sclerae anicteric.  Oropharynx clear and moist. No ulcerations or evidence of oropharyngeal candidiasis. Neck is supple.  NODES:  No cervical, supraclavicular, or axillary lymphadenopathy palpated.  BREAST EXAM:  right breast s/p lumpectomy and radiation, no sign of local recurrence, left breast benign LUNGS:  Clear to auscultation bilaterally.  No wheezes or rhonchi. HEART:  Regular rate and rhythm. No murmur appreciated. ABDOMEN:  Soft, nontender.   Positive, normoactive bowel sounds. No organomegaly palpated. MSK:  No focal spinal tenderness to palpation. Full range of motion bilaterally in the upper extremities. EXTREMITIES:  No peripheral edema.   SKIN:  Clear with no obvious rashes or skin changes. No nail dyscrasia. NEURO:  Nonfocal. Well oriented.  Appropriate affect.   LABORATORY DATA:  None for this visit.  DIAGNOSTIC IMAGING:  None for this visit.      ASSESSMENT AND PLAN:  Ms.. Fluegge is a pleasant 73 y.o. female with Stage IA right breast invasive ductal carcinoma, ER+/PR+/HER2-, diagnosed in 11/2021, treated with lumpectomy, adjuvant radiation therapy, and anti-estrogen therapy with Tamoxifen  beginning in 01/2022.  She tried tamoxifen  for a few months, tried lower dose but did not tolerate it well.  Ultimately given the arthralgias she has decided to hold the tamoxifen .    She continued to improve and is now back to jogging and is feeling much better.  She is reluctant to try aromatase inhibitors.  She is hoping to try tamoxifen  for a different manufacture.  She will take her old a bottle to the pharmacy and see if they can give her new bottle with a different manufacturer.  We have reviewed the mechanism of action of aromatase inhibitors, adverse effects with this class of medications today in detail.  She is not really hoping to try these, she is worried about the arthralgia resuming.  She also has baseline osteoporosis in the AP spine.  She was more keen to try tamoxifen  in the first place because of its positive effect on bone density.  Most recent mammogram negative, physical exam today small seroma about the surgical scar in the right breast, otherwise no palpable masses.  No regional adenopathy bilateral axilla.  She will keep us  posted either way if she decides to try tamoxifen  again.  Otherwise she will follow-up with surgery team in 6 months and with us  in 1 year.  Total encounter time:30 minutes*in face-to-face visit  time, chart review, lab review, care coordination, order entry, and documentation of the encounter time.  *Total Encounter Time as defined by the Centers for Medicare and Medicaid Services includes, in addition to the face-to-face time of a patient visit (documented in the note above) non-face-to-face time: obtaining and reviewing outside history, ordering and reviewing medications, tests or procedures, care coordination (communications with other health care professionals or caregivers) and documentation in the medical record.

## 2023-04-12 ENCOUNTER — Telehealth: Payer: Self-pay | Admitting: Hematology and Oncology

## 2023-04-12 NOTE — Telephone Encounter (Signed)
 Left patient a vm regarding upcoming appointment

## 2023-06-23 DIAGNOSIS — Z23 Encounter for immunization: Secondary | ICD-10-CM | POA: Diagnosis not present

## 2023-07-03 DIAGNOSIS — M1611 Unilateral primary osteoarthritis, right hip: Secondary | ICD-10-CM | POA: Diagnosis not present

## 2023-07-10 ENCOUNTER — Encounter: Payer: Self-pay | Admitting: Hematology and Oncology

## 2023-07-10 DIAGNOSIS — M1611 Unilateral primary osteoarthritis, right hip: Secondary | ICD-10-CM | POA: Diagnosis not present

## 2023-07-24 DIAGNOSIS — M25551 Pain in right hip: Secondary | ICD-10-CM | POA: Diagnosis not present

## 2023-07-24 DIAGNOSIS — M1611 Unilateral primary osteoarthritis, right hip: Secondary | ICD-10-CM | POA: Diagnosis not present

## 2023-08-07 DIAGNOSIS — M1611 Unilateral primary osteoarthritis, right hip: Secondary | ICD-10-CM | POA: Diagnosis not present

## 2023-08-07 DIAGNOSIS — R262 Difficulty in walking, not elsewhere classified: Secondary | ICD-10-CM | POA: Diagnosis not present

## 2023-08-07 DIAGNOSIS — M6281 Muscle weakness (generalized): Secondary | ICD-10-CM | POA: Diagnosis not present

## 2023-08-10 DIAGNOSIS — M1611 Unilateral primary osteoarthritis, right hip: Secondary | ICD-10-CM | POA: Diagnosis not present

## 2023-08-13 DIAGNOSIS — M1611 Unilateral primary osteoarthritis, right hip: Secondary | ICD-10-CM | POA: Diagnosis not present

## 2023-08-13 DIAGNOSIS — M6281 Muscle weakness (generalized): Secondary | ICD-10-CM | POA: Diagnosis not present

## 2023-08-13 DIAGNOSIS — R262 Difficulty in walking, not elsewhere classified: Secondary | ICD-10-CM | POA: Diagnosis not present

## 2023-08-23 DIAGNOSIS — M1611 Unilateral primary osteoarthritis, right hip: Secondary | ICD-10-CM | POA: Diagnosis not present

## 2023-08-23 DIAGNOSIS — R262 Difficulty in walking, not elsewhere classified: Secondary | ICD-10-CM | POA: Diagnosis not present

## 2023-08-23 DIAGNOSIS — M6281 Muscle weakness (generalized): Secondary | ICD-10-CM | POA: Diagnosis not present

## 2023-08-30 DIAGNOSIS — M6281 Muscle weakness (generalized): Secondary | ICD-10-CM | POA: Diagnosis not present

## 2023-08-30 DIAGNOSIS — M1611 Unilateral primary osteoarthritis, right hip: Secondary | ICD-10-CM | POA: Diagnosis not present

## 2023-08-30 DIAGNOSIS — R262 Difficulty in walking, not elsewhere classified: Secondary | ICD-10-CM | POA: Diagnosis not present

## 2023-09-03 DIAGNOSIS — Z170 Estrogen receptor positive status [ER+]: Secondary | ICD-10-CM | POA: Diagnosis not present

## 2023-09-03 DIAGNOSIS — C50411 Malignant neoplasm of upper-outer quadrant of right female breast: Secondary | ICD-10-CM | POA: Diagnosis not present

## 2023-09-06 DIAGNOSIS — R262 Difficulty in walking, not elsewhere classified: Secondary | ICD-10-CM | POA: Diagnosis not present

## 2023-09-06 DIAGNOSIS — M6281 Muscle weakness (generalized): Secondary | ICD-10-CM | POA: Diagnosis not present

## 2023-09-06 DIAGNOSIS — M1611 Unilateral primary osteoarthritis, right hip: Secondary | ICD-10-CM | POA: Diagnosis not present

## 2023-09-13 DIAGNOSIS — R262 Difficulty in walking, not elsewhere classified: Secondary | ICD-10-CM | POA: Diagnosis not present

## 2023-09-13 DIAGNOSIS — M1611 Unilateral primary osteoarthritis, right hip: Secondary | ICD-10-CM | POA: Diagnosis not present

## 2023-09-13 DIAGNOSIS — M6281 Muscle weakness (generalized): Secondary | ICD-10-CM | POA: Diagnosis not present

## 2023-09-20 DIAGNOSIS — M1611 Unilateral primary osteoarthritis, right hip: Secondary | ICD-10-CM | POA: Diagnosis not present

## 2023-09-20 DIAGNOSIS — M6281 Muscle weakness (generalized): Secondary | ICD-10-CM | POA: Diagnosis not present

## 2023-09-20 DIAGNOSIS — R262 Difficulty in walking, not elsewhere classified: Secondary | ICD-10-CM | POA: Diagnosis not present

## 2023-12-18 DIAGNOSIS — H43813 Vitreous degeneration, bilateral: Secondary | ICD-10-CM | POA: Diagnosis not present

## 2023-12-18 DIAGNOSIS — H401411 Capsular glaucoma with pseudoexfoliation of lens, right eye, mild stage: Secondary | ICD-10-CM | POA: Diagnosis not present

## 2023-12-18 DIAGNOSIS — H2513 Age-related nuclear cataract, bilateral: Secondary | ICD-10-CM | POA: Diagnosis not present

## 2023-12-18 DIAGNOSIS — H52203 Unspecified astigmatism, bilateral: Secondary | ICD-10-CM | POA: Diagnosis not present

## 2023-12-22 DIAGNOSIS — Z23 Encounter for immunization: Secondary | ICD-10-CM | POA: Diagnosis not present

## 2023-12-24 DIAGNOSIS — R928 Other abnormal and inconclusive findings on diagnostic imaging of breast: Secondary | ICD-10-CM | POA: Diagnosis not present

## 2023-12-28 ENCOUNTER — Encounter: Payer: Self-pay | Admitting: Adult Health

## 2024-01-05 DIAGNOSIS — Z23 Encounter for immunization: Secondary | ICD-10-CM | POA: Diagnosis not present

## 2024-01-17 DIAGNOSIS — L308 Other specified dermatitis: Secondary | ICD-10-CM | POA: Diagnosis not present

## 2024-02-05 DIAGNOSIS — Z681 Body mass index (BMI) 19 or less, adult: Secondary | ICD-10-CM | POA: Diagnosis not present

## 2024-02-05 DIAGNOSIS — Z124 Encounter for screening for malignant neoplasm of cervix: Secondary | ICD-10-CM | POA: Diagnosis not present

## 2024-02-05 DIAGNOSIS — N952 Postmenopausal atrophic vaginitis: Secondary | ICD-10-CM | POA: Diagnosis not present

## 2024-02-05 DIAGNOSIS — Z1272 Encounter for screening for malignant neoplasm of vagina: Secondary | ICD-10-CM | POA: Diagnosis not present

## 2024-04-11 ENCOUNTER — Inpatient Hospital Stay: Payer: Medicare Other | Admitting: Hematology and Oncology

## 2024-04-11 ENCOUNTER — Encounter: Payer: Self-pay | Admitting: *Deleted

## 2024-04-11 VITALS — BP 102/62 | HR 82 | Temp 97.3°F | Resp 17 | Ht 63.0 in | Wt 107.8 lb

## 2024-04-11 DIAGNOSIS — Z170 Estrogen receptor positive status [ER+]: Secondary | ICD-10-CM

## 2024-04-11 NOTE — Progress Notes (Signed)
 Per MD request RN successfully faxed [facility] orders to [facility] with expected date 12/29/24.

## 2024-04-11 NOTE — Progress Notes (Signed)
 " BRIEF ONCOLOGIC HISTORY:  Oncology History  Malignant neoplasm of upper-outer quadrant of right breast in female, estrogen receptor positive (HCC)  11/14/2021 Mammogram   Screening mammogram showed possible mass in the right breast, indeterminate.  Diagnostic mammogram showed a 1.7 cm irregular mass in the right breast suspicious of malignancy.   11/23/2021 Pathology Results   Pathology from the right breast needle core biopsy showed invasive ductal carcinoma, grade 2, prognostic showed ER 100% positive strong staining PR 100% positive strong staining, Ki-67 of 10% and HER2 of 1+   11/28/2021 Initial Diagnosis   Malignant neoplasm of upper-outer quadrant of right breast in female, estrogen receptor positive (HCC)   11/30/2021 Cancer Staging   Staging form: Breast, AJCC 8th Edition - Clinical stage from 11/30/2021: Stage IA (cT1c, cN0, cM0, G2, ER+, PR+, HER2-) - Signed by Loretha Ash, MD on 11/30/2021 Stage prefix: Initial diagnosis Histologic grading system: 3 grade system Laterality: Right Staged by: Pathologist and managing physician Stage used in treatment planning: Yes National guidelines used in treatment planning: Yes Type of national guideline used in treatment planning: NCCN   12/13/2021 Genetic Testing   Negative genetic testing: no pathogenic variants detected in [facility] CancerNextExpanded +RNAinsight Panel.  VUS in MSH3 at  p.V682L (c.2044G>C).  Report date is 12/13/2021.   The CancerNext-Expanded gene panel offered by [facility] and includes sequencing, rearrangement, and RNA analysis for the following 77 genes: AIP, ALK, APC, ATM, AXIN2, BAP1, BARD1, BLM, BMPR1A, BRCA1, BRCA2, BRIP1, CDC73, CDH1, CDK4, CDKN1B, CDKN2A, CHEK2, CTNNA1, DICER1, FANCC, FH, FLCN, GALNT12, KIF1B, LZTR1, MAX, MEN1, MET, MLH1, MSH2, MSH3, MSH6, MUTYH, NBN, NF1, NF2, NTHL1, PALB2, PHOX2B, PMS2, POT1, PRKAR1A, PTCH1, PTEN, RAD51C, RAD51D, RB1, RECQL, RET, SDHA, SDHAF2, SDHB, SDHC, SDHD, SMAD4, SMARCA4,  SMARCB1, SMARCE1, STK11, SUFU, TMEM127, TP53, TSC1, TSC2, VHL and XRCC2 (sequencing and deletion/duplication); EGFR, EGLN1, HOXB13, KIT, MITF, PDGFRA, POLD1, and POLE (sequencing only); EPCAM and GREM1 (deletion/duplication only).    12/20/2021 Cancer Staging   Staging form: Breast, AJCC 8th Edition - Pathologic stage from 12/20/2021: Stage IA (pT1c, pN0, cM0, G2, ER+, PR+, HER2-, Oncotype DX score: 10) - Signed by Crawford Morna Pickle, NP on 06/19/2022 Stage prefix: Initial diagnosis Multigene prognostic tests performed: Oncotype DX Recurrence score range: Less than 11 Histologic grading system: 3 grade system   01/30/2022 - 02/22/2022 Radiation Therapy   Plan Name: Breast_R Site: Breast, Right Technique: 3D Mode: Photon Dose Per Fraction: 2.67 Gy Prescribed Dose (Delivered / Prescribed): 40.05 Gy / 40.05 Gy Prescribed Fxs (Delivered / Prescribed): 15 / 15   Plan Name: Breast_R_Bst Site: Breast, Right Technique: Electron Mode: Electron Dose Per Fraction: 2.5 Gy Prescribed Dose (Delivered / Prescribed): 7.5 Gy / 7.5 Gy Prescribed Fxs (Delivered / Prescribed): 3 / 3   01/2022 -  Anti-estrogen oral therapy   Tamoxifen      INTERVAL HISTORY:   Discussed the use of AI scribe software for clinical note transcription with the patient, who gave verbal consent to proceed.  History of Present Illness Megan Phillips is a 74 year old female with stage IA ER+/PR+/HER2- invasive ductal carcinoma of the right breast, status post lumpectomy and adjuvant radiation, presenting for routine oncology surveillance.  She completed lumpectomy in October 2023 and adjuvant whole breast radiation in December 2023. She discontinued tamoxifen  due to arthralgias and is not on anti-estrogen therapy. Her most recent mammogram was normal. She has no new or changing symptoms in the right breast or surgical site and is asymptomatic from an oncologic perspective.  She inquired about breast MRI  given her inability to tolerate tamoxifen . She has no genetic mutations and breast density is category C. The option of contrast-enhanced mammography was discussed as an alternative to MRI, with plans to schedule this in place of her routine mammogram in October.  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 She recently underwent a Pap smear and reports vaginal dryness.  She recently underwent right hip replacement for longstanding pain, resulting in improved mobility and activity. She is now walking frequently and able to run short distances, with no current musculoskeletal complaints.  Apr 11, 2023: Follow-up after right breast lumpectomy and completion of adjuvant radiation therapy; reviewed discontinuation of Tamoxifen  due to arthralgias, reluctance to switch to aromatase inhibitors given osteoporosis; small seroma at surgical site, no evidence of malignancy on October 2024 mammogram; overall well, resumed jogging, plans to try Tamoxifen  from a different manufacturer.  Rest of the pertinent 10 point ROS reviewed and negative  PAST MEDICAL/SURGICAL HISTORY:  Past Medical History:  Diagnosis Date   ADHD (attention deficit hyperactivity disorder)    Anxiety    Breast cancer (HCC) 11/2021   right breast IDC   Complication of anesthesia    Depression    Endometrioid adenocarcinoma 06/2009   Stage 1A grade 1   Family history of breast cancer 11/30/2021   History of endometrial cancer 11/30/2021   PONV (postoperative nausea and vomiting)    Past Surgical History:  Procedure Laterality Date   ABDOMINAL HYSTERECTOMY  06/04/2009   Robotic Hyst, BSO, pelvic lymph node dissection   BREAST LUMPECTOMY WITH RADIOACTIVE SEED LOCALIZATION Right 12/20/2021   Procedure: RIGHT BREAST LUMPECTOMY WITH RADIOACTIVE SEED LOCALIZATION;  Surgeon: Vanderbilt Ned, MD;  Location: [facility];  Service: General;  Laterality:  Right;   COLONOSCOPY     DILATION AND CURETTAGE OF UTERUS       ALLERGIES:  No Known Allergies   CURRENT MEDICATIONS:  Outpatient Encounter Medications as of 04/11/2024  Medication Sig   estradiol (ESTRACE) 0.01 % CREA vaginal cream Place 1 Applicatorful vaginally 3 (three) times a week.   fexofenadine (ALLEGRA ALLERGY) 180 MG tablet Take 1 tablet every day by oral route.   methylphenidate (RITALIN) 5 MG tablet Take 1 tablet by mouth 2 (two) times daily.   escitalopram (LEXAPRO) 10 MG tablet 10 mg daily.   sertraline (ZOLOFT) 50 MG tablet Take 50 mg by mouth daily.   tamoxifen  (NOLVADEX ) 20 MG tablet Take 1 tablet (20 mg total) by mouth daily. (Patient not taking: Reported on 06/19/2022)   No facility-administered encounter medications on file as of 04/11/2024.     ONCOLOGIC FAMILY HISTORY:  Family History  Problem Relation Age of Onset   Hyperlipidemia Mother    Heart disease Mother    Stroke Mother    Uterine cancer Mother 22       ? or other reason for hysterectomy   Heart disease Father    Heart disease Brother    Breast cancer Maternal Aunt        dx 9s   Breast cancer Other        mat aunt's granddaughter; dx 4s     SOCIAL HISTORY:  Social History   Socioeconomic History   Marital status: Married    Spouse name: Not on file   Number of children: Not on file   Years of education: Not on file   Highest education level: Not on  file  Occupational History   Not on file  Tobacco Use   Smoking status: Never   Smokeless tobacco: Not on file  Substance and Sexual Activity   Alcohol use: Yes    Comment: Few glasses of wine a week   Drug use: Never   Sexual activity: Yes    Birth control/protection: Surgical    Comment: hyst  Other Topics Concern   Not on file  Social History Narrative   Not on file   Social Drivers of Health   Tobacco Use: Low Risk  (09/03/2023)   Received from [facility]   Patient History    Smoking Tobacco Use:  Never    Smokeless Tobacco Use: Never    Passive Exposure: Not on file  Financial Resource Strain: Low Risk (11/30/2021)   Overall Financial Resource Strain (CARDIA)    Difficulty of Paying Living Expenses: Not hard at all  Food Insecurity: No Food Insecurity (11/30/2021)   Hunger Vital Sign    Worried About Running Out of Food in the Last Year: Never true    Ran Out of Food in the Last Year: Never true  Transportation Needs: No Transportation Needs (11/30/2021)   PRAPARE - Administrator, Civil Service (Medical): No    Lack of Transportation (Non-Medical): No  Physical Activity: Not on file  Stress: Not on file  Social Connections: Not on file  Intimate Partner Violence: Not on file  Depression (PHQ2-9): Low Risk (04/11/2024)   Depression (PHQ2-9)    PHQ-2 Score: 0  Alcohol Screen: Not on file  Housing: Unknown (09/03/2023)   Received from [facility]   Epic    Unable to Pay for Housing in the Last Year: Not on file    Number of Times Moved in the Last Year: Not on file    At any time in the past 12 months, were you homeless or living in a shelter (including now)?: No  Utilities: Not At Risk (11/30/2021)   [facility] Utilities    Threatened with loss of utilities: No  Health Literacy: Not on file     OBSERVATIONS/OBJECTIVE:  BP 102/62 (BP Location: Left Arm)   Pulse 82   Temp (!) 97.3 F (36.3 C)   Resp 17   Ht 5' 3 (1.6 m)   Wt 107 lb 12.8 oz (48.9 kg)   SpO2 97%   BMI 19.10 kg/m   GENERAL: Patient is a well appearing female in no acute distress Scattered density in both breasts No palpable breast masses No regional adenopathy   LABORATORY DATA:  None for this visit.  DIAGNOSTIC IMAGING:  None for this visit.      ASSESSMENT AND PLAN:  Ms.. Spidle is a pleasant 74 y.o. female with Stage IA right breast invasive ductal carcinoma, ER+/PR+/HER2-, diagnosed in 11/2021, treated with lumpectomy, adjuvant radiation therapy, and anti-estrogen  therapy with Tamoxifen  beginning in 01/2022.  She couldn't tolerate the tamoxifen  and is currently under observation. She didn't want to consider other anti estrogen therapy. Assessment & Plan  Estrogen receptor positive breast cancer of the right upper-outer quadrant Remains in remission post-lumpectomy and radiation.  - Faxed order for contrast mammogram to [facility] for December 30, 2023. - Provided guidance on contrast mammogram versus MRI, including gadolinium and IV contrast discussion. - Advised increased oral hydration pre- and post-contrast mammogram. - Scheduled annual oncology follow-up and surgical follow-up every 6 months.  History of uterine cancer Stage IA, Grade 1 endometrioid adenocarcinoma  in remission. Recent Pap smear showed atrophy. Prognosis favorable. - Discussed rationale for continued Pap smears due to prior uterine cancer.  Total encounter time:30 minutes*in face-to-face visit time, chart review, lab review, care coordination, order entry, and documentation of the encounter time.  *Total Encounter Time as defined by the Centers for Medicare and Medicaid Services includes, in addition to the face-to-face time of a patient visit (documented in the note above) non-face-to-face time: obtaining and reviewing outside history, ordering and reviewing medications, tests or procedures, care coordination (communications with other health care professionals or caregivers) and documentation in the medical record.   "

## 2025-04-10 ENCOUNTER — Inpatient Hospital Stay: Admitting: Hematology and Oncology
